# Patient Record
Sex: Female | Born: 1995 | Hispanic: Yes | Marital: Single | Smoking: Never smoker
Health system: Southern US, Community
[De-identification: ages and names within clinical notes are randomized; demographics above are authoritative.]

## PROBLEM LIST (undated history)

## (undated) DIAGNOSIS — K8050 Calculus of bile duct without cholangitis or cholecystitis without obstruction: Secondary | ICD-10-CM

## (undated) DIAGNOSIS — E282 Polycystic ovarian syndrome: Secondary | ICD-10-CM

## (undated) DIAGNOSIS — G5600 Carpal tunnel syndrome, unspecified upper limb: Secondary | ICD-10-CM

## (undated) HISTORY — PX: WISDOM TOOTH EXTRACTION: SHX21

## (undated) HISTORY — DX: Polycystic ovarian syndrome: E28.2

---
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
## 2017-08-22 ENCOUNTER — Other Ambulatory Visit: Payer: Self-pay

## 2017-08-22 ENCOUNTER — Emergency Department
Admission: EM | Admit: 2017-08-22 | Discharge: 2017-08-22 | Disposition: A | Discharge status: Home / Self Care | Payer: Medicaid Other | Attending: Emergency Medicine | Admitting: Emergency Medicine

## 2017-08-22 ENCOUNTER — Encounter: Payer: Self-pay | Admitting: *Deleted

## 2017-08-22 DIAGNOSIS — K2970 Gastritis, unspecified, without bleeding: Secondary | ICD-10-CM | POA: Diagnosis not present

## 2017-08-22 DIAGNOSIS — R079 Chest pain, unspecified: Secondary | ICD-10-CM

## 2017-08-22 LAB — CBC
HCT: 37.1 % (ref 35.0–47.0)
Hemoglobin: 12.8 g/dL (ref 12.0–16.0)
MCH: 29.8 pg (ref 26.0–34.0)
MCHC: 34.5 g/dL (ref 32.0–36.0)
MCV: 86.4 fL (ref 80.0–100.0)
Platelets: 270 10*3/uL (ref 150–440)
RBC: 4.29 MIL/uL (ref 3.80–5.20)
RDW: 13.9 % (ref 11.5–14.5)
WBC: 8.9 10*3/uL (ref 3.6–11.0)

## 2017-08-22 LAB — BASIC METABOLIC PANEL
Anion gap: 7 (ref 5–15)
BUN: 11 mg/dL (ref 6–20)
CALCIUM: 9.1 mg/dL (ref 8.9–10.3)
CO2: 28 mmol/L (ref 22–32)
CREATININE: 0.59 mg/dL (ref 0.44–1.00)
Chloride: 103 mmol/L (ref 101–111)
GFR calc Af Amer: >60 mL/min (ref >60)
GFR calc non Af Amer: >60 mL/min (ref >60)
Glucose, Bld: 142 mg/dL — ABNORMAL HIGH (ref 65–99)
Potassium: 3.5 mmol/L (ref 3.5–5.1)
Sodium: 138 mmol/L (ref 135–145)

## 2017-08-22 LAB — TROPONIN I

## 2017-08-22 LAB — POCT PREGNANCY, URINE: Preg Test, Ur: NEGATIVE

## 2017-08-22 MED ORDER — FAMOTIDINE 40 MG PO TABS: 40.0000 mg | ORAL_TABLET | Freq: Every evening | ORAL | 1 refills | Status: DC

## 2017-08-22 MED ORDER — SUCRALFATE 1 G PO TABS: 1.0000 g | ORAL_TABLET | Freq: Four times a day (QID) | ORAL | 0 refills | Status: DC

## 2017-08-22 MED ORDER — LIDOCAINE VISCOUS 2 % MT SOLN
15.0000 mL | Freq: Once | OROMUCOSAL | Status: AC
  Administered 2017-08-22: 15 mL via OROMUCOSAL
  Filled 2017-08-22: qty 15

## 2017-08-22 NOTE — ED Provider Notes (Signed)
[facility] Emergency Department Provider Note __________________________________   I have reviewed the triage vital signs and the nursing notes.   HISTORY  Chief Complaint Chest Pain   History limited by: Not Limited   HPI Kim Barrett is a 22 y.o. female who presents to the emergency department today because of chest pain.   LOCATION:central chest DURATION:2 days TIMING: waxing and waning SEVERITY: severe QUALITY: sharp CONTEXT: patient denies doing any unusual activity when the pain started. Has come and gone for the past couple of days. Has had somewhat similar pain in the past but nothing as severe. MODIFYING FACTORS: none ASSOCIATED SYMPTOMS: denies any shortness of breath. No fever.  Per medical record review patient has no significant past medical history.  No past medical history on file.  There are no active problems to display for this patient.     Prior to Admission medications   Not on File    Allergies Robitussin 12 hour cough [dextromethorphan polistirex er]  No family history on file.  Social History Social History   Tobacco Use  . Smoking status: Never Smoker  . Smokeless tobacco: Never Used  Substance Use Topics  . Alcohol use: No    Frequency: Never  . Drug use: No    Review of Systems Constitutional: No fever/chills Eyes: No visual changes. ENT: No sore throat. Cardiovascular: Positive for chest pain. Respiratory: Denies shortness of breath. Gastrointestinal: No abdominal pain.  No nausea, no vomiting.  No diarrhea.   Genitourinary: Negative for dysuria. Musculoskeletal: Negative for back pain. Skin: Negative for rash. Neurological: Negative for headaches, focal weakness or numbness.  ____________________________________________   PHYSICAL EXAM:  VITAL SIGNS: ED Triage Vitals  Enc Vitals Group     BP 08/22/17 1949 (!) 123/54     Pulse Rate 08/22/17 1949 85     Resp 08/22/17 1949 20    Temp 08/22/17 1949 98.7 F (37.1 C)     Temp Source 08/22/17 1949 Oral     SpO2 08/22/17 1949 99 %     Weight 08/22/17 1945 224 lb (101.6 kg)     Height 08/22/17 1945 5' (1.524 m)     Head Circumference --      Peak Flow --      Pain Score 08/22/17 1945 9   Constitutional: Alert and oriented. Well appearing and in no distress. Eyes: Conjunctivae are normal.  ENT   Head: Normocephalic and atraumatic.   Nose: No congestion/rhinnorhea.   Mouth/Throat: Mucous membranes are moist.   Neck: No stridor. Hematological/Lymphatic/Immunilogical: No cervical lymphadenopathy. Cardiovascular: Normal rate, regular rhythm.  No murmurs, rubs, or gallops.  Respiratory: Normal respiratory effort without tachypnea nor retractions. Breath sounds are clear and equal bilaterally. No wheezes/rales/rhonchi. Gastrointestinal: Soft and non tender. No rebound. No guarding.  Genitourinary: Deferred Musculoskeletal: Normal range of motion in all extremities. No lower extremity edema. Neurologic:  Normal speech and language. No gross focal neurologic deficits are appreciated.  Skin:  Skin is warm, dry and intact. No rash noted. Psychiatric: Mood and affect are normal. Speech and behavior are normal. Patient exhibits appropriate insight and judgment.  ____________________________________________    LABS (pertinent positives/negatives)  Upreg negative CBC wnl BMP wnl except glu 142 Trop  <0.03  ____________________________________________   EKG  I, Phineas Semen, attending physician, personally viewed and interpreted this EKG  EKG Time: 1952 Rate: 86 Rhythm: normal sinus rhythm Axis: normal Intervals: qtc 421 QRS: narrow ST changes: no st elevation Impression: normal ekg  ____________________________________________    RADIOLOGY  None   ____________________________________________   PROCEDURES  Procedures  ____________________________________________   INITIAL  IMPRESSION / ASSESSMENT AND PLAN / ED COURSE  Pertinent labs & imaging results that were available during my care of the patient were reviewed by me and considered in my medical decision making (see chart for details).  Patient presents with chest pain. ddx would include cardiac cause, esophagitis, gastritis, costochondritis, pna, pe, ptx amongst other etiologies. Patient not complaining of any shortness of breath, no fever. Given lack of pulmonary complaints held x-ray. She did feel better after lidocaine swallow. Think likely then esophagitis/gastritis. Will prescribe antacid and sucralfate. Discussed findings and thoughts with patient. Discussed plan with patient.   ____________________________________________   FINAL CLINICAL IMPRESSION(S) / ED DIAGNOSES  Final diagnoses:  Chest pain, unspecified type  Gastritis, presence of bleeding unspecified, unspecified chronicity, unspecified gastritis type     Note: This dictation was prepared with Dragon dictation. Any transcriptional errors that result from this process are unintentional     Phineas SemenGoodman, Lucas Winograd, MD 08/22/17 2234

## 2017-08-22 NOTE — ED Triage Notes (Signed)
Pt reports chest pain for 3 days. Pain in center of chest.  No cough or fever. Non radiaiting pain.  No n/v/d.  Pt states she feels sore.  Pt alert.  Speech clear.

## 2017-08-22 NOTE — ED Notes (Signed)
Pt c/o sharp chest pain located in center of chest that radiates into bilat shoulders and abd - pt also c/o nausea - chest pain started Monday night but nausea and abd pain started last week

## 2017-08-22 NOTE — ED Notes (Signed)
Pt states that the medication administered helped with her pain. States pain has decreased. No distress noted. Family at bedside.

## 2017-08-22 NOTE — Discharge Instructions (Signed)
Please seek medical attention for any high fevers, chest pain, shortness of breath, change in behavior, persistent vomiting, bloody stool or any other new or concerning symptoms.  

## 2018-09-18 ENCOUNTER — Ambulatory Visit: Payer: Self-pay | Admitting: Maternal Newborn

## 2020-04-12 ENCOUNTER — Other Ambulatory Visit: Payer: Self-pay

## 2020-04-12 ENCOUNTER — Encounter: Payer: Self-pay | Admitting: Family Medicine

## 2020-04-12 ENCOUNTER — Ambulatory Visit: Payer: Self-pay | Admitting: Family Medicine

## 2020-04-12 DIAGNOSIS — Z113 Encounter for screening for infections with a predominantly sexual mode of transmission: Secondary | ICD-10-CM

## 2020-04-12 DIAGNOSIS — E282 Polycystic ovarian syndrome: Secondary | ICD-10-CM | POA: Insufficient documentation

## 2020-04-12 LAB — WET PREP FOR TRICH, YEAST, CLUE
Trichomonas Exam: NEGATIVE
Yeast Exam: NEGATIVE

## 2020-04-12 NOTE — Progress Notes (Signed)
[facility] Department STI clinic/screening visit  Subjective:  Carolyna Yerian is a 24 y.o. female being seen today for No chief complaint on file.    The patient reports they do have symptoms. Patient reports that they do not desire a pregnancy in the next year. They reported they are not interested in discussing contraception today.   Patient has the following medical conditions:   Patient Active Problem List   Diagnosis Date Noted  . PCOS (polycystic ovarian syndrome) 04/12/2020    HPI  Pt reports she is here for STI screening. Has noticed yellow discharge x a few days. No other symptoms.   See flowsheet for further details and programmatic requirements.    Patient's last menstrual period was 02/22/2020 (approximate). Last sex: May 28 BCM: condom sometimes Desires EC? n/a  Last pap per pt/review of record: at [facility] in 05/2019 Last HIV test per pt/review of record: never  Last tetanus vaccine: 2010, none since per pt   No components found for: HCV  The following portions of the patient's history were reviewed and updated as appropriate: allergies, current medications, past medical history, past social history, past surgical history and problem list.  Objective:  There were no vitals filed for this visit.   Physical Exam Vitals and nursing note reviewed.  Constitutional:      Appearance: Normal appearance.  HENT:     Head: Normocephalic and atraumatic.     Mouth/Throat:     Mouth: Mucous membranes are moist.     Pharynx: Oropharynx is clear. No oropharyngeal exudate or posterior oropharyngeal erythema.  Pulmonary:     Effort: Pulmonary effort is normal.  Abdominal:     General: Abdomen is flat.     Palpations: There is no mass.     Tenderness: There is no abdominal tenderness. There is no rebound.  Genitourinary:    General: Normal vulva.     Exam position: Lithotomy position.     Pubic Area: No rash or pubic lice.      Labia:         Right: No rash or lesion.        Left: No rash or lesion.      Vagina: Vaginal discharge (white, ph<4.5) present. No erythema, bleeding or lesions.     Cervix: No cervical motion tenderness, discharge, friability, lesion or erythema.     Uterus: Normal.      Adnexa: Right adnexa normal and left adnexa normal.     Rectum: Normal.  Lymphadenopathy:     Head:     Right side of head: No preauricular or posterior auricular adenopathy.     Left side of head: No preauricular or posterior auricular adenopathy.     Cervical: No cervical adenopathy.     Upper Body:     Right upper body: No supraclavicular or axillary adenopathy.     Left upper body: No supraclavicular or axillary adenopathy.     Lower Body: No right inguinal adenopathy. No left inguinal adenopathy.  Skin:    General: Skin is warm and dry.     Findings: No rash.  Neurological:     Mental Status: She is alert and oriented to person, place, and time.      Assessment and Plan:  Karenna Romanoff is a 24 y.o. female presenting to the [facility] Department for STI screening   1. Screening examination for venereal disease -Pt with symptoms. Screenings today as below. Treat wet prep per standing  order. -Patient does not meet criteria for HepB, HepC Screening.  -Counseled on warning s/sx and when to seek care. Recommended condom use with all sex and discussed importance of condom use for STI prevention. -She declines BCM discussion today, advised to RTC if ever desired.  - WET PREP FOR TRICH, YEAST, CLUE - Chlamydia/Gonorrhea [facility] - HIV [facility] - Syphilis Serology, [facility]  Tetanus vaccine offered today but pt cannot stay. Advised to RTC when able for this.    Return if symptoms worsen or fail to improve.  No future appointments.  Ann Held, PA-C

## 2020-04-12 NOTE — Progress Notes (Signed)
In for STD screen. Reports symptoms today. Desires Tetanus shot, but left early before administration. Encouraged to make another apt. Sharlyne Pacas, RN

## 2020-05-25 ENCOUNTER — Ambulatory Visit (INDEPENDENT_AMBULATORY_CARE_PROVIDER_SITE_OTHER): Payer: BC Managed Care – PPO | Admitting: Orthopaedic Surgery

## 2020-05-25 ENCOUNTER — Encounter: Payer: Self-pay | Admitting: Orthopaedic Surgery

## 2020-05-25 DIAGNOSIS — G5603 Carpal tunnel syndrome, bilateral upper limbs: Secondary | ICD-10-CM

## 2020-05-25 NOTE — Progress Notes (Signed)
   Office Visit Note   Patient: Kim Barrett           Date of Birth: 05-16-1996           MRN: [id number] Visit Date: 05/25/2020              Requested by: No referring provider defined for this encounter. PCP: Pcp, No   Assessment & Plan: Visit Diagnoses:  1. Bilateral carpal tunnel syndrome     Plan: Impression is mild bilateral carpal tunnel syndrome.  We will refer the patient to Dr. Alvester Morin for nerve conduction study/EMG both upper extremities.  She will follow up with [place] once has been completed.  Follow-Up Instructions: Return for after NCS/EMG.   Orders:  No orders of the defined types were placed in this encounter.  No orders of the defined types were placed in this encounter.     Procedures: No procedures performed   Clinical Data: No additional findings.   Subjective: Chief Complaint  Patient presents with  . Right Hand - Pain  . Left Hand - Pain    HPI patient is a pleasant 24 year old left-hand-dominant female who presents to clinic today with bilateral hand tingling both equally as bad.  She noticed this approximately 1 year ago.  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  She has noticed her symptoms were initially worse at night but since she has been wearing a wrist splint to both sides her symptoms have improved.  She does not take any medication for this.  The tingling that she has is located to both index, long and ring fingers.  No previous nerve conduction study.  Review of Systems as detailed in HPI.  All others reviewed and are negative.   Objective: Vital Signs: There were no vitals taken for this visit.  Physical Exam well-developed well-nourished female no acute distress.  Alert and oriented x3.  Ortho Exam right wrist exam shows negative Phalen, positive Tinel and no thenar atrophy.  Full grip strength.  Left wrist is negative Phalen, negative Tinel and no thenar atrophy.  Full grip strength.  She is neurovascular  intact both sides.  Specialty Comments:  No specialty comments available.  Imaging: No new imaging   PMFS History: Patient Active Problem List   Diagnosis Date Noted  . PCOS (polycystic ovarian syndrome) 04/12/2020   History reviewed. No pertinent past medical history.  History reviewed. No pertinent family history.  History reviewed. No pertinent surgical history. Social History   Occupational History  . Not on file  Tobacco Use  . Smoking status: Never Smoker  . Smokeless tobacco: Never Used  Vaping Use  . Vaping Use: Never used  Substance and Sexual Activity  . Alcohol use: No  . Drug use: No  . Sexual activity: Yes    Partners: Male    Birth control/protection: None

## 2020-05-26 ENCOUNTER — Other Ambulatory Visit: Payer: Self-pay

## 2020-05-26 DIAGNOSIS — G5603 Carpal tunnel syndrome, bilateral upper limbs: Secondary | ICD-10-CM

## 2020-05-27 ENCOUNTER — Telehealth: Payer: Self-pay | Admitting: Orthopaedic Surgery

## 2020-05-27 NOTE — Telephone Encounter (Signed)
10/12 ov note faxed to [facility]/referring office [phone], attn: Herbert Seta

## 2020-06-18 ENCOUNTER — Other Ambulatory Visit: Payer: Self-pay

## 2020-06-18 ENCOUNTER — Ambulatory Visit (INDEPENDENT_AMBULATORY_CARE_PROVIDER_SITE_OTHER): Payer: BC Managed Care – PPO | Admitting: Physical Medicine and Rehabilitation

## 2020-06-18 ENCOUNTER — Encounter: Payer: Self-pay | Admitting: Physical Medicine and Rehabilitation

## 2020-06-18 DIAGNOSIS — R202 Paresthesia of skin: Secondary | ICD-10-CM | POA: Diagnosis not present

## 2020-06-18 NOTE — Progress Notes (Signed)
Kim Barrett - 24 y.o. female MRN [id number]  Date of birth: 11-May-1996  Office Visit Note: Visit Date: 06/18/2020 PCP: Pcp, No Referred by: Tarry Kos, MD  Subjective: Chief Complaint  Patient presents with  . Left Hand - Numbness, Pain, Weakness  . Left Arm - Pain, Numbness, Weakness  . Left Wrist - Weakness, Numbness, Pain   HPI:  Kim Barrett is a 24 y.o. female who comes in today at the request of Dr. Glee Arvin for electrodiagnostic study of the Bilateral upper extremities.  Patient is Left hand dominant. bilateral hand tingling both equally as bad.  She noticed this approximately 1 year ago.  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  She has noticed her symptoms were initially worse at night but since she has been wearing a wrist splint to both sides her symptoms have improved.  She does not take any medication for this.  The tingling that she has is located to both index, long and ring fingers.  No previous nerve conduction study.  Average pain is 6 out of 10.   ROS Otherwise per HPI.  Assessment & Plan: Visit Diagnoses:  1. Paresthesia of skin     Plan: Impression: The above electrodiagnostic study is ABNORMAL and reveals evidence of a mild to moderate Bilateral median nerve entrapment at the wrist (carpal tunnel syndrome) affecting sensory components.   There is no significant electrodiagnostic evidence of any other focal nerve entrapment, brachial plexopathy or cervical radiculopathy.   Recommendations: 1.  Follow-up with referring physician. 2.  Continue current management of symptoms. 3.  Continue use of resting splint at night-time and as needed during the day.  Meds & Orders: No orders of the defined types were placed in this encounter.   Orders Placed This Encounter  Procedures  . NCV with EMG (electromyography)    Follow-up: Return for  Glee Arvin, MD.   Procedures: No procedures performed  EMG & NCV Findings: Evaluation  of the right median motor nerve showed decreased conduction velocity (Elbow-Wrist, 49 m/s).  The left median (across palm) sensory nerve showed prolonged distal peak latency (Wrist, 4.5 ms), reduced amplitude (8.1 V), and prolonged distal peak latency (Palm, 2.2 ms).  The right median (across palm) sensory nerve showed prolonged distal peak latency (Wrist, 5.6 ms) and prolonged distal peak latency (Palm, 2.7 ms).  All remaining nerves (as indicated in the following tables) were within normal limits.  All left vs. right side differences were within normal limits.    All examined muscles (as indicated in the following table) showed no evidence of electrical instability.    Impression: The above electrodiagnostic study is ABNORMAL and reveals evidence of a mild to moderate Bilateral median nerve entrapment at the wrist (carpal tunnel syndrome) affecting sensory components.   There is no significant electrodiagnostic evidence of any other focal nerve entrapment, brachial plexopathy or cervical radiculopathy.   Recommendations: 1.  Follow-up with referring physician. 2.  Continue current management of symptoms. 3.  Continue use of resting splint at night-time and as needed during the day.  ___________________________ Elease Hashimoto Board Certified, American Board of Physical Medicine and Rehabilitation    Nerve Conduction Studies Anti Sensory Summary Table   Stim Site NR Peak (ms) Norm Peak (ms) P-T Amp (V) Norm P-T Amp Site1 Site2 Delta-P (ms) Dist (cm) Vel (m/s) Norm Vel (m/s)  Left Median Acr Palm Anti Sensory (2nd Digit)  30.3C  Wrist    *  4.5 <3.6 *8.1 >10 Wrist Palm 2.3 0.0    Palm    *2.2 <2.0 3.7         Right Median Acr Palm Anti Sensory (2nd Digit)  30.7C  Wrist    *5.6 <3.6 18.6 >10 Wrist Palm 2.9 0.0    Palm    *2.7 <2.0 21.1         Left Radial Anti Sensory (Base 1st Digit)  31.4C  Wrist    2.0 <3.1 25.3  Wrist Base 1st Digit 2.0 0.0    Left Ulnar Anti Sensory (5th  Digit)  31C  Wrist    2.9 <3.7 25.7 >15.0 Wrist 5th Digit 2.9 14.0 48 >38   Motor Summary Table   Stim Site NR Onset (ms) Norm Onset (ms) O-P Amp (mV) Norm O-P Amp Site1 Site2 Delta-0 (ms) Dist (cm) Vel (m/s) Norm Vel (m/s)  Left Median Motor (Abd Poll Brev)  31.7C  Wrist    4.1 <4.2 5.3 >5 Elbow Wrist 3.9 19.5 50 >50  Elbow    8.0  1.4         Right Median Motor (Abd Poll Brev)  30.7C  Wrist    4.1 <4.2 8.6 >5 Elbow Wrist 3.5 17.0 *49 >50  Elbow    7.6  7.4         Left Ulnar Motor (Abd Dig Min)  31.8C  Wrist    2.6 <4.2 9.6 >3 B Elbow Wrist 2.6 18.5 71 >53  B Elbow    5.2  9.8  A Elbow B Elbow 1.3 10.0 77 >53  A Elbow    6.5  9.7          EMG   Side Muscle Nerve Root Ins Act Fibs Psw Amp Dur Poly Recrt Int Dennie Bible Comment  Left Abd Poll Brev Median C8-T1 Nml Nml Nml Nml Nml 0 Nml Nml   Left 1stDorInt Ulnar C8-T1 Nml Nml Nml Nml Nml 0 Nml Nml   Left PronatorTeres Median C6-7 Nml Nml Nml Nml Nml 0 Nml Nml     Nerve Conduction Studies Anti Sensory Left/Right Comparison   Stim Site L Lat (ms) R Lat (ms) L-R Lat (ms) L Amp (V) R Amp (V) L-R Amp (%) Site1 Site2 L Vel (m/s) R Vel (m/s) L-R Vel (m/s)  Median Acr Palm Anti Sensory (2nd Digit)  30.3C  Wrist *4.5 *5.6 1.1 *8.1 18.6 56.5 Wrist Palm     Palm *2.2 *2.7 0.5 3.7 21.1 82.5       Radial Anti Sensory (Base 1st Digit)  31.4C  Wrist 2.0   25.3   Wrist Base 1st Digit     Ulnar Anti Sensory (5th Digit)  31C  Wrist 2.9   25.7   Wrist 5th Digit 48     Motor Left/Right Comparison   Stim Site L Lat (ms) R Lat (ms) L-R Lat (ms) L Amp (mV) R Amp (mV) L-R Amp (%) Site1 Site2 L Vel (m/s) R Vel (m/s) L-R Vel (m/s)  Median Motor (Abd Poll Brev)  31.7C  Wrist 4.1 4.1 0.0 5.3 8.6 38.4 Elbow Wrist 50 *49 1  Elbow 8.0 7.6 0.4 1.4 7.4 81.1       Ulnar Motor (Abd Dig Min)  31.8C  Wrist 2.6   9.6   B Elbow Wrist 71    B Elbow 5.2   9.8   A Elbow B Elbow 77    A Elbow 6.5   9.7  Waveforms:                  Clinical History: No specialty comments available.     Objective:  VS:  HT:    WT:   BMI:     BP:   HR: bpm  TEMP: ( )  RESP:  Physical Exam Musculoskeletal:        General: No swelling, tenderness or deformity.     Comments: Inspection reveals no atrophy of the bilateral APB or FDI or hand intrinsics. There is no swelling, color changes, allodynia or dystrophic changes. There is 5 out of 5 strength in the bilateral wrist extension, finger abduction and long finger flexion. There is intact sensation to light touch in all dermatomal and peripheral nerve distributions.  There is a negative Hoffmann's test bilaterally.  Skin:    General: Skin is warm and dry.     Findings: No erythema or rash.  Neurological:     General: No focal deficit present.     Mental Status: She is alert and oriented to person, place, and time.     Motor: No weakness or abnormal muscle tone.     Coordination: Coordination normal.  Psychiatric:        Mood and Affect: Mood normal.        Behavior: Behavior normal.      Imaging: No results found.

## 2020-06-18 NOTE — Progress Notes (Signed)
Pt state left wrist , hand and arm is where the pain is. Pt state while she sleep and driving she feels pain in her hand. Pt state of her hands falling a sleep while holding her phone and texting. Pt state she is left handed.  Numeric Pain Rating Scale and Functional Assessment Average Pain 6   In the last MONTH (on 0-10 scale) has pain interfered with the following?  1. General activity like being  able to carry out your everyday physical activities such as walking, climbing stairs, carrying groceries, or moving a chair?  Rating(10)

## 2020-06-21 NOTE — Procedures (Signed)
EMG & NCV Findings: Evaluation of the right median motor nerve showed decreased conduction velocity (Elbow-Wrist, 49 m/s).  The left median (across palm) sensory nerve showed prolonged distal peak latency (Wrist, 4.5 ms), reduced amplitude (8.1 V), and prolonged distal peak latency (Palm, 2.2 ms).  The right median (across palm) sensory nerve showed prolonged distal peak latency (Wrist, 5.6 ms) and prolonged distal peak latency (Palm, 2.7 ms).  All remaining nerves (as indicated in the following tables) were within normal limits.  All left vs. right side differences were within normal limits.    All examined muscles (as indicated in the following table) showed no evidence of electrical instability.    Impression: The above electrodiagnostic study is ABNORMAL and reveals evidence of a mild to moderate Bilateral median nerve entrapment at the wrist (carpal tunnel syndrome) affecting sensory components.   There is no significant electrodiagnostic evidence of any other focal nerve entrapment, brachial plexopathy or cervical radiculopathy.   Recommendations: 1.  Follow-up with referring physician. 2.  Continue current management of symptoms. 3.  Continue use of resting splint at night-time and as needed during the day.  ___________________________ Elease Hashimoto Board Certified, American Board of Physical Medicine and Rehabilitation    Nerve Conduction Studies Anti Sensory Summary Table   Stim Site NR Peak (ms) Norm Peak (ms) P-T Amp (V) Norm P-T Amp Site1 Site2 Delta-P (ms) Dist (cm) Vel (m/s) Norm Vel (m/s)  Left Median Acr Palm Anti Sensory (2nd Digit)  30.3C  Wrist    *4.5 <3.6 *8.1 >10 Wrist Palm 2.3 0.0    Palm    *2.2 <2.0 3.7         Right Median Acr Palm Anti Sensory (2nd Digit)  30.7C  Wrist    *5.6 <3.6 18.6 >10 Wrist Palm 2.9 0.0    Palm    *2.7 <2.0 21.1         Left Radial Anti Sensory (Base 1st Digit)  31.4C  Wrist    2.0 <3.1 25.3  Wrist Base 1st Digit 2.0 0.0      Left Ulnar Anti Sensory (5th Digit)  31C  Wrist    2.9 <3.7 25.7 >15.0 Wrist 5th Digit 2.9 14.0 48 >38   Motor Summary Table   Stim Site NR Onset (ms) Norm Onset (ms) O-P Amp (mV) Norm O-P Amp Site1 Site2 Delta-0 (ms) Dist (cm) Vel (m/s) Norm Vel (m/s)  Left Median Motor (Abd Poll Brev)  31.7C  Wrist    4.1 <4.2 5.3 >5 Elbow Wrist 3.9 19.5 50 >50  Elbow    8.0  1.4         Right Median Motor (Abd Poll Brev)  30.7C  Wrist    4.1 <4.2 8.6 >5 Elbow Wrist 3.5 17.0 *49 >50  Elbow    7.6  7.4         Left Ulnar Motor (Abd Dig Min)  31.8C  Wrist    2.6 <4.2 9.6 >3 B Elbow Wrist 2.6 18.5 71 >53  B Elbow    5.2  9.8  A Elbow B Elbow 1.3 10.0 77 >53  A Elbow    6.5  9.7          EMG   Side Muscle Nerve Root Ins Act Fibs Psw Amp Dur Poly Recrt Int Dennie Bible Comment  Left Abd Poll Brev Median C8-T1 Nml Nml Nml Nml Nml 0 Nml Nml   Left 1stDorInt Ulnar C8-T1 Nml Nml Nml Nml Nml 0 Nml Nml  Left PronatorTeres Median C6-7 Nml Nml Nml Nml Nml 0 Nml Nml     Nerve Conduction Studies Anti Sensory Left/Right Comparison   Stim Site L Lat (ms) R Lat (ms) L-R Lat (ms) L Amp (V) R Amp (V) L-R Amp (%) Site1 Site2 L Vel (m/s) R Vel (m/s) L-R Vel (m/s)  Median Acr Palm Anti Sensory (2nd Digit)  30.3C  Wrist *4.5 *5.6 1.1 *8.1 18.6 56.5 Wrist Palm     Palm *2.2 *2.7 0.5 3.7 21.1 82.5       Radial Anti Sensory (Base 1st Digit)  31.4C  Wrist 2.0   25.3   Wrist Base 1st Digit     Ulnar Anti Sensory (5th Digit)  31C  Wrist 2.9   25.7   Wrist 5th Digit 48     Motor Left/Right Comparison   Stim Site L Lat (ms) R Lat (ms) L-R Lat (ms) L Amp (mV) R Amp (mV) L-R Amp (%) Site1 Site2 L Vel (m/s) R Vel (m/s) L-R Vel (m/s)  Median Motor (Abd Poll Brev)  31.7C  Wrist 4.1 4.1 0.0 5.3 8.6 38.4 Elbow Wrist 50 *49 1  Elbow 8.0 7.6 0.4 1.4 7.4 81.1       Ulnar Motor (Abd Dig Min)  31.8C  Wrist 2.6   9.6   B Elbow Wrist 71    B Elbow 5.2   9.8   A Elbow B Elbow 77    A Elbow 6.5   9.7            Waveforms:

## 2020-06-25 ENCOUNTER — Other Ambulatory Visit: Payer: Self-pay

## 2020-06-25 ENCOUNTER — Ambulatory Visit (INDEPENDENT_AMBULATORY_CARE_PROVIDER_SITE_OTHER): Payer: BC Managed Care – PPO | Admitting: Orthopaedic Surgery

## 2020-06-25 DIAGNOSIS — G5603 Carpal tunnel syndrome, bilateral upper limbs: Secondary | ICD-10-CM | POA: Diagnosis not present

## 2020-06-25 MED ORDER — BUPIVACAINE HCL 0.5 % IJ SOLN
1.0000 mL | INTRAMUSCULAR | Status: AC | PRN
  Administered 2020-06-25: 1 mL

## 2020-06-25 MED ORDER — METHYLPREDNISOLONE ACETATE 40 MG/ML IJ SUSP
40.0000 mg | INTRAMUSCULAR | Status: AC | PRN
  Administered 2020-06-25: 40 mg

## 2020-06-25 MED ORDER — LIDOCAINE HCL 1 % IJ SOLN
1.0000 mL | INTRAMUSCULAR | Status: AC | PRN
  Administered 2020-06-25: 1 mL

## 2020-06-25 NOTE — Progress Notes (Signed)
   Office Visit Note   Patient: Kim Barrett           Date of Birth: Aug 15, 1995           MRN: [id number] Visit Date: 06/25/2020              Requested by: No referring provider defined for this encounter. PCP: Pcp, No   Assessment & Plan: Visit Diagnoses:  1. Bilateral carpal tunnel syndrome     Plan: Impression is mild to moderate bilateral carpal tunnel syndrome.  Based on discussion of treatment options patient elected to have bilateral carpal tunnel injections.  She will continue to wear braces at night.  Follow-up as needed.  Follow-Up Instructions: Return if symptoms worsen or fail to improve.   Orders:  Orders Placed This Encounter  Procedures  . Hand/UE Inj   No orders of the defined types were placed in this encounter.     Procedures: Hand/UE Inj: bilateral carpal tunnel for carpal tunnel syndrome on 06/25/2020 9:30 AM Indications: pain Details: 25 G needle Medications (Right): 1 mL lidocaine 1 %; 1 mL bupivacaine 0.5 %; 40 mg methylPREDNISolone acetate 40 MG/ML Medications (Left): 1 mL lidocaine 1 %; 1 mL bupivacaine 0.5 %; 40 mg methylPREDNISolone acetate 40 MG/ML Outcome: tolerated well, no immediate complications Patient was prepped and draped in the usual sterile fashion.       Clinical Data: No additional findings.   Subjective: Chief Complaint  Patient presents with  . Right Hand - Follow-up  . Left Hand - Follow-up    Patient returns today following nerve conduction studies.  Results were mild to moderate bilateral carpal tunnel syndrome.   Review of Systems   Objective: Vital Signs: There were no vitals taken for this visit.  Physical Exam  Ortho Exam Exams are unchanged. Specialty Comments:  No specialty comments available.  Imaging: No results found.   PMFS History: Patient Active Problem List   Diagnosis Date Noted  . PCOS (polycystic ovarian syndrome) 04/12/2020   No past medical history on file.  No family  history on file.  No past surgical history on file. Social History   Occupational History  . Not on file  Tobacco Use  . Smoking status: Never Smoker  . Smokeless tobacco: Never Used  Vaping Use  . Vaping Use: Never used  Substance and Sexual Activity  . Alcohol use: No  . Drug use: No  . Sexual activity: Yes    Partners: Male    Birth control/protection: None

## 2021-10-14 ENCOUNTER — Ambulatory Visit: Payer: Self-pay | Admitting: Family Medicine

## 2021-10-14 ENCOUNTER — Encounter: Payer: Self-pay | Admitting: Family Medicine

## 2021-10-14 ENCOUNTER — Other Ambulatory Visit: Payer: Self-pay

## 2021-10-14 DIAGNOSIS — Z113 Encounter for screening for infections with a predominantly sexual mode of transmission: Secondary | ICD-10-CM

## 2021-10-14 DIAGNOSIS — B3731 Acute candidiasis of vulva and vagina: Secondary | ICD-10-CM

## 2021-10-14 LAB — WET PREP FOR TRICH, YEAST, CLUE: Trichomonas Exam: NEGATIVE

## 2021-10-14 LAB — HM HIV SCREENING LAB: HM HIV Screening: NEGATIVE

## 2021-10-14 MED ORDER — CLOTRIMAZOLE 1 % VA CREA: 1.0000 | TOPICAL_CREAM | Freq: Every day | VAGINAL | 0 refills | Status: AC

## 2021-10-14 NOTE — Progress Notes (Signed)
[facility] Department ? ?STI clinic/screening visit ?[address] ?[place] [address] ?[phone] ? ?Subjective:  ?Kim Barrett is a 26 y.o. female being seen today for an STI screening visit. The patient reports they do not have symptoms.  Patient reports that they do not desire a pregnancy in the next year.   They reported they are not interested in discussing contraception today.   ? ?No LMP recorded. (Menstrual status: Irregular Periods). ? ? ?Patient has the following medical conditions:   ?Patient Active Problem List  ? Diagnosis Date Noted  ? PCOS (polycystic ovarian syndrome) 04/12/2020  ? ? ?Chief Complaint  ?Patient presents with  ? SEXUALLY TRANSMITTED DISEASE  ?  screening  ? ? ?HPI ? ?Patient reports here for screening, denies s/sx  ? ?Last HIV test per patient/review of record was 03/2020 ?Patient reports last pap was 2019 @ [facility].  ? ?Screening for MPX risk: ?Does the patient have an unexplained rash? No ?Is the patient MSM? No ?Does the patient endorse multiple sex partners or anonymous sex partners? No ?Did the patient have close or sexual contact with a person diagnosed with MPX? No ?Has the patient traveled outside the [place] where MPX is endemic? No ?Is there a high clinical suspicion for MPX-- evidenced by one of the following No ? -Unlikely to be chickenpox ? -Lymphadenopathy ? -Rash that present in same phase of evolution on any given body part ?See flowsheet for further details and programmatic requirements.  ? ? ?The following portions of the patient's history were reviewed and updated as appropriate: allergies, current medications, past medical history, past social history, past surgical history and problem list. ? ?Objective:  ?There were no vitals filed for this visit. ? ?Physical Exam ?Vitals and nursing note reviewed.  ?Constitutional:   ?   Appearance: Normal appearance.  ?HENT:  ?   Head: Normocephalic and atraumatic.  ?   Mouth/Throat:  ?   Mouth:  Mucous membranes are moist.  ?   Pharynx: Oropharynx is clear. No oropharyngeal exudate or posterior oropharyngeal erythema.  ?Pulmonary:  ?   Effort: Pulmonary effort is normal.  ?Abdominal:  ?   General: Abdomen is flat.  ?   Palpations: There is no mass.  ?   Tenderness: There is no abdominal tenderness. There is no rebound.  ?Genitourinary: ?   Exam position: Lithotomy position.  ?   Pubic Area: No rash or pubic lice.   ?   Labia:     ?   Right: No rash or lesion.     ?   Left: No rash or lesion.   ?   Vagina: No erythema, bleeding or lesions.  ?   Cervix: No cervical motion tenderness, discharge, friability, lesion or erythema.  ?   Uterus: Normal.   ?   Adnexa: Right adnexa normal and left adnexa normal.  ?   Comments: Pt self collected  ?Lymphadenopathy:  ?   Head:  ?   Right side of head: No preauricular or posterior auricular adenopathy.  ?   Left side of head: No preauricular or posterior auricular adenopathy.  ?   Cervical: No cervical adenopathy.  ?   Upper Body:  ?   Right upper body: No supraclavicular or axillary adenopathy.  ?   Left upper body: No supraclavicular or axillary adenopathy.  ?   Lower Body: No right inguinal adenopathy. No left inguinal adenopathy.  ?Skin: ?   General: Skin is warm and  dry.  ?   Findings: No rash.  ?Neurological:  ?   Mental Status: She is alert and oriented to person, place, and time.  ? ? ? ?Assessment and Plan:  ?Kim Barrett is a 26 y.o. female presenting to the [facility] Department for STI screening ? ?1. Screening examination for venereal disease ?Patient accepted all screenings including wet prep oral, vaginal CT/GC and bloodwork for HIV/RPR.  ?Patient meets criteria for HepB screening? No. Ordered? No -   ?Patient meets criteria for HepC screening? No. Ordered? No does not meet criteria  ? ?Wet prep results  neg  ?Treatment needed  ?Discussed time lin for State Lab results and that patient will be called with positive results and encouraged  patient to call if she had not heard in 2 weeks.  ?Counseled to return or seek care for continued or worsening symptoms ?Recommended condom use with all sex ? ?Patient is currently using  NO BCM   to prevent pregnancy.   ?- HIV [place] ?- Syphilis Serology, [place] [facility] ?- WET PREP FOR TRICH, YEAST, CLUE ?- Chlamydia/Gonorrhea [facility] ? ? ? ? ?Return for as needed. ? ?No future appointments. ? ?Wendi Snipes, FNP ?

## 2021-10-14 NOTE — Progress Notes (Signed)
Pt here for STD screening.  Wet mount results reviewed and medication dispensed per Provider orders.  Pt declined condoms. Martika Egler M Esli Clements, RN  

## 2022-02-23 ENCOUNTER — Other Ambulatory Visit: Payer: Self-pay | Admitting: Adult Health

## 2022-02-23 DIAGNOSIS — N946 Dysmenorrhea, unspecified: Secondary | ICD-10-CM

## 2022-02-28 ENCOUNTER — Ambulatory Visit
Admission: RE | Admit: 2022-02-28 | Discharge: 2022-02-28 | Disposition: A | Discharge status: Home / Self Care | Payer: BC Managed Care – PPO | Source: Ambulatory Visit | Attending: Adult Health | Admitting: Adult Health

## 2022-02-28 DIAGNOSIS — N946 Dysmenorrhea, unspecified: Secondary | ICD-10-CM

## 2022-03-28 ENCOUNTER — Ambulatory Visit: Payer: Self-pay | Admitting: Nurse Practitioner

## 2022-03-28 DIAGNOSIS — Z113 Encounter for screening for infections with a predominantly sexual mode of transmission: Secondary | ICD-10-CM

## 2022-03-28 LAB — HM HIV SCREENING LAB: HM HIV Screening: NEGATIVE

## 2022-03-28 LAB — WET PREP FOR TRICH, YEAST, CLUE
Trichomonas Exam: NEGATIVE
Yeast Exam: NEGATIVE

## 2022-03-28 NOTE — Progress Notes (Signed)
[facility] Department  STI clinic/screening visit [address] [place] [address] [phone]  Subjective:  Kim Barrett is a 26 y.o. female being seen today for an STI screening visit. The patient reports they do not have symptoms.  Patient reports that they do not desire a pregnancy in the next year.   They reported they are not interested in discussing contraception today.    No LMP recorded (lmp unknown). (Menstrual status: Irregular Periods).   Patient has the following medical conditions:   Patient Active Problem List   Diagnosis Date Noted   PCOS (polycystic ovarian syndrome) 04/12/2020    Chief Complaint  Patient presents with   SEXUALLY TRANSMITTED DISEASE    Screening-no symptoms    HPI  Patient reports to clinic today for STD screening.  Patient reports she is asymptomatic.    Last HIV test per patient/review of record was 03/2020. Patient reports last pap was 2020.   Screening for MPX risk: Does the patient have an unexplained rash? No Is the patient MSM? No Does the patient endorse multiple sex partners or anonymous sex partners? No Did the patient have close or sexual contact with a person diagnosed with MPX? No Has the patient traveled outside the [place] where MPX is endemic? No Is there a high clinical suspicion for MPX-- evidenced by one of the following No  -Unlikely to be chickenpox  -Lymphadenopathy  -Rash that present in same phase of evolution on any given body part See flowsheet for further details and programmatic requirements.   Immunization history:  Immunization History  Administered Date(s) Administered   Hepatitis A 10/27/2009, 01/24/2012   Hepatitis B 04/26/1998, 08/16/1998, 10/14/1998   Hpv-Unspecified 01/24/2012, 07/24/2012, 06/09/2014   Tdap 03/18/2009     The following portions of the patient's history were reviewed and updated as appropriate: allergies, current medications, past medical history, past  social history, past surgical history and problem list.  Objective:  There were no vitals filed for this visit.  Physical Exam Constitutional:      Appearance: Normal appearance.  HENT:     Head: Normocephalic. No abrasion, masses or laceration. Hair is normal.     Right Ear: External ear normal.     Left Ear: External ear normal.     Nose: Nose normal.     Mouth/Throat:     Mouth: No oral lesions.     Dentition: No dental caries.     Pharynx: No oropharyngeal exudate or posterior oropharyngeal erythema.     Tonsils: No tonsillar exudate or tonsillar abscesses.  Eyes:     General: Lids are normal.        Right eye: No discharge.        Left eye: No discharge.     Conjunctiva/sclera: Conjunctivae normal.     Right eye: No exudate.    Left eye: No exudate. Abdominal:     General: Abdomen is flat.     Palpations: Abdomen is soft.     Tenderness: There is no abdominal tenderness. There is no rebound.  Genitourinary:    Comments: Deferred, patient desires to self collect  Musculoskeletal:     Cervical back: Full passive range of motion without pain, normal range of motion and neck supple.  Lymphadenopathy:     Cervical: No cervical adenopathy.     Right cervical: No superficial, deep or posterior cervical adenopathy.    Left cervical: No superficial, deep or posterior cervical adenopathy.     Upper  Body:     Right upper body: No supraclavicular, axillary or epitrochlear adenopathy.     Left upper body: No supraclavicular, axillary or epitrochlear adenopathy.  Skin:    General: Skin is warm and dry.     Findings: No lesion or rash.  Neurological:     Mental Status: She is alert and oriented to person, place, and time.  Psychiatric:        Attention and Perception: Attention normal.        Mood and Affect: Mood normal.        Speech: Speech normal.        Behavior: Behavior normal. Behavior is cooperative.      Assessment and Plan:  Kim Barrett is a 26 y.o.  female presenting to the [facility] Department for STI screening  1. Screening examination for venereal disease -26 year old female in clinic today for STD screening.  -Patient accepted all screenings including oral GC, vaginal CT/GC , wet prep and bloodwork for HIV/RPR.  Patient meets criteria for HepB screening? No. Ordered? No - low risk Patient meets criteria for HepC screening? No. Ordered? No - low risk  Treat wet prep per standing order Discussed time line for State Lab results and that patient will be called with positive results and encouraged patient to call if she had not heard in 2 weeks.  Counseled to return or seek care for continued or worsening symptoms Recommended condom use with all sex  Patient is currently not using  contraception   to prevent pregnancy.    - HIV [facility] - Syphilis Serology, [place] [facility] - Chlamydia/Gonorrhea [facility] - WET PREP FOR TRICH, YEAST, CLUE - Gonococcus culture  Total time spent: 30 minutes    Return if symptoms worsen or fail to improve.   Glenna Fellows, FNP

## 2022-03-28 NOTE — Progress Notes (Signed)
Pt here for STI screening.  Wet mount reviewed.  No treatment needed at this time.  Condoms declined.-Collins Scotland, RN

## 2022-04-02 LAB — GONOCOCCUS CULTURE

## 2023-01-03 ENCOUNTER — Ambulatory Visit (INDEPENDENT_AMBULATORY_CARE_PROVIDER_SITE_OTHER): Payer: BC Managed Care – PPO | Admitting: Obstetrics and Gynecology

## 2023-01-03 ENCOUNTER — Encounter: Payer: Self-pay | Admitting: Obstetrics and Gynecology

## 2023-01-03 VITALS — BP 122/77 | HR 105 | Ht 60.0 in | Wt 282.5 lb

## 2023-01-03 DIAGNOSIS — Z7689 Persons encountering health services in other specified circumstances: Secondary | ICD-10-CM

## 2023-01-03 DIAGNOSIS — E282 Polycystic ovarian syndrome: Secondary | ICD-10-CM

## 2023-01-03 DIAGNOSIS — N939 Abnormal uterine and vaginal bleeding, unspecified: Secondary | ICD-10-CM

## 2023-01-03 DIAGNOSIS — N926 Irregular menstruation, unspecified: Secondary | ICD-10-CM | POA: Diagnosis not present

## 2023-01-03 DIAGNOSIS — Z3041 Encounter for surveillance of contraceptive pills: Secondary | ICD-10-CM

## 2023-01-03 MED ORDER — LEVONORGEST-ETH ESTRAD 91-DAY 0.15-0.03 &0.01 MG PO TABS: 1.0000 | ORAL_TABLET | Freq: Every day | ORAL | 3 refills | Status: DC

## 2023-01-03 NOTE — Progress Notes (Signed)
Patient presents today to discuss irregular menstrual cycle. She states skipping her cycle for months and then bleeding/spotting for prolonged periods of time. She states a history of being diagnosed with PCO, currently uses nothing for birth control.

## 2023-01-03 NOTE — Progress Notes (Signed)
HPI:      [name] is a 27 y.o. G0P0000 who LMP was No LMP recorded. (Menstrual status: Irregular Periods).  Subjective:   She presents today stating that she has very irregular cycles and sometimes no cycles at all.  This pattern is not new to her.  She was previously on Seasonique birth control and she had periods every 3 months.  She reports a previous diagnosis of PCO.  She was tried on metformin without success but OCPs have worked for her in the past.  She reports that she was also told she had insulin resistance based on acanthosis lesions.  She stopped birth control pills several months ago but had irregular bleeding so she restarted them for a while and they kept her from bleeding.  However when she stopped pills again she bled.  She is not interested in pregnancy at this time. Patient complains of a new's onset of clear vaginal discharge which she is sure is not urine.  She states this has been present for more than 6 months.  She has previously had BV and reports it does not like that.  She also states that she has been tested for vaginal STDs since this discharge has started and it has proven negative.    Hx: The following portions of the patient's history were reviewed and updated as appropriate:             She  has a past medical history of PCOS (polycystic ovarian syndrome). She does not have any pertinent problems on file. She  has a past surgical history that includes Wisdom tooth extraction. Her family history is not on file. She  reports that she has never smoked. She has never used smokeless tobacco. She reports current alcohol use. She reports that she does not use drugs. She has a current medication list which includes the following prescription(s): levonorgestrel-ethinyl estradiol. She is allergic to guaifenesin, bee venom, and robitussin 12 hour cough [dextromethorphan polistirex er].       Review of Systems:  Review of Systems  Constitutional: Denied  constitutional symptoms, night sweats, recent illness, fatigue, fever, insomnia and weight loss.  Eyes: Denied eye symptoms, eye pain, photophobia, vision change and visual disturbance.  Ears/Nose/Throat/Neck: Denied ear, nose, throat or neck symptoms, hearing loss, nasal discharge, sinus congestion and sore throat.  Cardiovascular: Denied cardiovascular symptoms, arrhythmia, chest pain/pressure, edema, exercise intolerance, orthopnea and palpitations.  Respiratory: Denied pulmonary symptoms, asthma, pleuritic pain, productive sputum, cough, dyspnea and wheezing.  Gastrointestinal: Denied, gastro-esophageal reflux, melena, nausea and vomiting.  Genitourinary: See HPI for additional information.  Musculoskeletal: Denied musculoskeletal symptoms, stiffness, swelling, muscle weakness and myalgia.  Dermatologic: Denied dermatology symptoms, rash and scar.  Neurologic: Denied neurology symptoms, dizziness, headache, neck pain and syncope.  Psychiatric: Denied psychiatric symptoms, anxiety and depression.  Endocrine: See HPI for additional information.   Meds:   No current outpatient medications on file prior to visit.   No current facility-administered medications on file prior to visit.      Objective:     Vitals:   01/03/23 1501  BP: 122/77  Pulse: (!) 105   Filed Weights   01/03/23 1501  Weight: 282 lb 8 oz (128.1 kg)                        Assessment:    G0P0000 Patient Active Problem List   Diagnosis Date Noted   PCOS (polycystic ovarian syndrome) 04/12/2020  1. Establishing care with new doctor, encounter for   2. Abnormal uterine bleeding   3. Irregular menstruation   4. PCO (polycystic ovaries)   5. Surveillance for birth control, oral contraceptives     Patient with PCO and likely insulin resistance.  Possibly not previously formally tested.  Cycles previously controlled on OCPs.   Plan:            1.  Will restart Seasonique as risk of blood clot  outweighed by signs and symptoms as well as sequela of PCO.  We have discussed this in some detail.  As long as patient does well on OCPs and is not considering pregnancy no official testing is necessary.  Possibly check for insulin resistance at her annual exam with any additional blood work necessary.  Will also plan her next Pap at her annual exam visit.  2.  Annual exam visit planned for 6 months as well as follow-up of OCPs.  3.  Will plan follow-up of vaginal discharge if still present at annual exam.  If patient would like to be seen prior to that for further workup she has been asked to make an appointment. Orders No orders of the defined types were placed in this encounter.    Meds ordered this encounter  Medications   Levonorgestrel-Ethinyl Estradiol (AMETHIA) 0.15-0.03 &0.01 MG tablet    Sig: Take 1 tablet by mouth at bedtime.    Dispense:  84 tablet    Refill:  3      F/U  Return in about 6 months (around 07/06/2023) for Annual Physical. I spent 32 minutes involved in the care of this patient preparing to see the patient by obtaining and reviewing her medical history (including labs, imaging tests and prior procedures), documenting clinical information in the electronic health record (EHR), counseling and coordinating care plans, writing and sending prescriptions, ordering tests or procedures and in direct communicating with the patient and medical staff discussing pertinent items from her history and physical exam.  [name], M.D. 01/03/2023 3:43 PM

## 2023-01-23 ENCOUNTER — Other Ambulatory Visit: Payer: Self-pay

## 2023-01-23 ENCOUNTER — Telehealth: Payer: Self-pay

## 2023-01-23 DIAGNOSIS — Z3041 Encounter for surveillance of contraceptive pills: Secondary | ICD-10-CM

## 2023-01-23 DIAGNOSIS — E282 Polycystic ovarian syndrome: Secondary | ICD-10-CM

## 2023-01-23 MED ORDER — DESOGESTREL-ETHINYL ESTRADIOL 0.15-0.02/0.01 MG (21/5) PO TABS: 1.0000 | ORAL_TABLET | Freq: Every day | ORAL | 2 refills | Status: DC

## 2023-01-23 NOTE — Telephone Encounter (Signed)
Per Dr. Logan Bores, patient is able to try Mircette. 3 month supply sent to pharmacy. Unable to leave voicemail, sent mychart message.

## 2023-01-23 NOTE — Telephone Encounter (Signed)
Pt calling; saw DJE  who rx'd pills for her PCOS; they are making her nausea, cramping, and makes her stomach hurt.  Is there a diff pill she can take to help with the PCOS?  [phone]

## 2023-01-26 ENCOUNTER — Other Ambulatory Visit (HOSPITAL_COMMUNITY): Payer: Self-pay | Admitting: Surgery

## 2023-02-21 ENCOUNTER — Ambulatory Visit (HOSPITAL_COMMUNITY)
Admission: RE | Admit: 2023-02-21 | Discharge: 2023-02-21 | Disposition: A | Discharge status: Home / Self Care | Payer: BC Managed Care – PPO | Source: Ambulatory Visit | Attending: Surgery | Admitting: Surgery

## 2023-02-21 DIAGNOSIS — Z01818 Encounter for other preprocedural examination: Secondary | ICD-10-CM | POA: Diagnosis present

## 2023-02-22 ENCOUNTER — Encounter: Payer: Self-pay | Admitting: Advanced Practice Midwife

## 2023-02-22 ENCOUNTER — Ambulatory Visit: Payer: BC Managed Care – PPO | Admitting: Advanced Practice Midwife

## 2023-02-22 DIAGNOSIS — F325 Major depressive disorder, single episode, in full remission: Secondary | ICD-10-CM

## 2023-02-22 DIAGNOSIS — Z113 Encounter for screening for infections with a predominantly sexual mode of transmission: Secondary | ICD-10-CM

## 2023-02-22 LAB — WET PREP FOR TRICH, YEAST, CLUE
Trichomonas Exam: NEGATIVE
Yeast Exam: NEGATIVE

## 2023-02-22 LAB — PREGNANCY, URINE: Preg Test, Ur: NEGATIVE

## 2023-02-22 LAB — HM HIV SCREENING LAB: HM HIV Screening: NEGATIVE

## 2023-02-22 NOTE — Progress Notes (Signed)
Pt is here for STD screening.  Wet mount result reviewed, no treatment required per SO.  Condoms declined.  Berdie Ogren, RN

## 2023-02-22 NOTE — Progress Notes (Signed)
[facility]  STI clinic/screening visit [address] [place] [address] [phone]  Subjective:  Kim Barrett is a 27 y.o.SHF nonsmoker nullip female being seen today for an STI screening visit. The patient reports they do not have symptoms.  Patient reports that they do not desire a pregnancy in the next year.   They reported they are not interested in discussing contraception today.    No LMP recorded. (Menstrual status: Irregular Periods).  Patient has the following medical conditions:   Patient Active Problem List   Diagnosis Date Noted   Morbid obesity (HCC) 282 lbs 02/22/2023   PCOS (polycystic ovarian syndrome) 04/12/2020    Chief Complaint  Patient presents with   SEXUALLY TRANSMITTED DISEASE    HPI  Patient reports asymptomatic. Began ocp's in June for PCOS. LMP not sure maybe May? Last sex 02/15/23 without condom; with current partner x 3 wks; 3 sex partners in last 3 mo. Last ETOH 02/15/23 (1 wine cooler) 1-2x/mo. Last pap age 39 wnl.  Does the patient using douching products? No  Last HIV test per patient/review of record was  Lab Results  Component Value Date   HMHIVSCREEN Negative - Validated 03/28/2022   No results found for: "HIV" Patient reports last pap was No results found for: "DIAGPAP" No results found for: "SPECADGYN"  Screening for MPX risk: Does the patient have an unexplained rash? No Is the patient MSM? No Does the patient endorse multiple sex partners or anonymous sex partners? Yes Did the patient have close or sexual contact with a person diagnosed with MPX? No Has the patient traveled outside the [place] where MPX is endemic? No Is there a high clinical suspicion for MPX-- evidenced by one of the following No  -Unlikely to be chickenpox  -Lymphadenopathy  -Rash that present in same phase of evolution on any given body part See flowsheet for further details and programmatic requirements.   Immunization  history:  Immunization History  Administered Date(s) Administered   Hepatitis A 10/27/2009, 01/24/2012   Hepatitis B 04/26/1998, 08/16/1998, 10/14/1998   Hpv-Unspecified 01/24/2012, 07/24/2012, 06/09/2014   Tdap 03/18/2009     The following portions of the patient's history were reviewed and updated as appropriate: allergies, current medications, past medical history, past social history, past surgical history and problem list.  Objective:  There were no vitals filed for this visit.  Physical Exam Vitals and nursing note reviewed.  Constitutional:      Appearance: Normal appearance. She is obese.  HENT:     Head: Normocephalic and atraumatic.     Mouth/Throat:     Mouth: Mucous membranes are moist.     Pharynx: Oropharynx is clear. No oropharyngeal exudate or posterior oropharyngeal erythema.  Eyes:     Conjunctiva/sclera: Conjunctivae normal.  Pulmonary:     Effort: Pulmonary effort is normal.  Abdominal:     Palpations: Abdomen is soft. There is no mass.     Tenderness: There is no abdominal tenderness. There is no rebound.     Comments: Soft without masses or tenderness  Genitourinary:    General: Normal vulva.     Exam position: Lithotomy position.     Pubic Area: No rash or pubic lice.      Labia:        Right: No rash or lesion.        Left: No rash or lesion.      Vagina: Vaginal discharge (white creamy leukorrhea, ph<4.5) present. No erythema,  bleeding or lesions.     Cervix: Normal. No cervical motion tenderness, discharge, friability, lesion or erythema.     Uterus: Normal.      Rectum: Normal.     Comments: pH = <4.5 Lymphadenopathy:     Head:     Right side of head: No preauricular or posterior auricular adenopathy.     Left side of head: No preauricular or posterior auricular adenopathy.     Cervical: No cervical adenopathy.     Right cervical: No superficial, deep or posterior cervical adenopathy.    Left cervical: No superficial, deep or posterior  cervical adenopathy.     Upper Body:     Right upper body: No supraclavicular, axillary or epitrochlear adenopathy.     Left upper body: No supraclavicular, axillary or epitrochlear adenopathy.     Lower Body: No right inguinal adenopathy. No left inguinal adenopathy.  Skin:    General: Skin is warm and dry.     Findings: No rash.  Neurological:     Mental Status: She is alert and oriented to person, place, and time.      Assessment and Plan:  Kim Barrett is a 27 y.o. female presenting to the [facility] Department for STI screening  1. Screening examination for venereal disease Treat wet mount per standing orders Immunization nurse consult  - WET PREP FOR TRICH, YEAST, CLUE - Pregnancy, urine - Syphilis Serology, [facility] - HIV [facility] - Chlamydia/Gonorrhea [facility] - Gonococcus culture  2. Morbid obesity (HCC) 282 lbs    Patient accepted all screenings including oral, vaginal CT/GC and bloodwork for HIV/RPR, and wet prep. Patient meets criteria for HepB screening? No. Ordered? no Patient meets criteria for HepC screening? No. Ordered? no  Treat wet prep per standing order Discussed time line for [facility] results and that patient will be called with positive results and encouraged patient to call if she had not heard in 2 weeks.  Counseled to return or seek care for continued or worsening symptoms Recommended repeat testing in 3 months with positive results. Recommended condom use with all sex  Patient is currently using  ocp's  to prevent pregnancy.    No follow-ups on file.  Future Appointments  Date Time Provider Department Center  03/09/2023 10:55 AM Linzie Collin, MD [facility] None  03/15/2023  2:00 PM Beulah Gandy, RD [facility]    Alberteen Spindle, CNM

## 2023-02-23 ENCOUNTER — Ambulatory Visit: Payer: BC Managed Care – PPO | Admitting: Dietician

## 2023-02-27 LAB — GONOCOCCUS CULTURE

## 2023-03-09 ENCOUNTER — Encounter: Payer: Self-pay | Admitting: Obstetrics and Gynecology

## 2023-03-09 ENCOUNTER — Ambulatory Visit (INDEPENDENT_AMBULATORY_CARE_PROVIDER_SITE_OTHER): Payer: BC Managed Care – PPO | Admitting: Obstetrics and Gynecology

## 2023-03-09 VITALS — BP 114/78 | HR 84 | Ht 60.0 in | Wt 282.2 lb

## 2023-03-09 DIAGNOSIS — N941 Unspecified dyspareunia: Secondary | ICD-10-CM | POA: Diagnosis not present

## 2023-03-09 NOTE — Progress Notes (Signed)
Patient presents today to discuss pain with intercourse. She states always experiencing pain and pressure but believed it would go away with time. She states she has tried lubricants with no relief. Denies postcoital bleeding. No additional concerns.

## 2023-03-09 NOTE — Progress Notes (Signed)
HPI:      [name] is a 27 y.o. G0P0000 who LMP was No LMP recorded (lmp unknown). (Menstrual status: Oral contraceptives).  Subjective:   She presents today because she has pain with sex.  She reports that she has had pain with sex her entire life.  There is never been a time without pain during intercourse.  She states that it is mainly a problem of the deep insertion.  She has no pain on the outside.  She reports that she has been tested recently for STDs, has tried lubrication both without success.  She says that sometimes when she has the pain he can withdrawal and then reinserted and the pain resolves.  She does not feel as if the patient is position dependent. She is currently taking OCPs for birth control.    Hx: The following portions of the patient's history were reviewed and updated as appropriate:             She  has a past medical history of PCOS (polycystic ovarian syndrome). She does not have any pertinent problems on file. She  has a past surgical history that includes Wisdom tooth extraction. Her family history is not on file. She  reports that she has never smoked. She has never used smokeless tobacco. She reports current alcohol use of about 1.0 standard drink of alcohol per week. She reports that she does not use drugs. She has a current medication list which includes the following prescription(s): desogestrel-ethinyl estradiol. She is allergic to guaifenesin, bee venom, and robitussin 12 hour cough [dextromethorphan polistirex er].       Review of Systems:  Review of Systems  Constitutional: Denied constitutional symptoms, night sweats, recent illness, fatigue, fever, insomnia and weight loss.  Eyes: Denied eye symptoms, eye pain, photophobia, vision change and visual disturbance.  Ears/Nose/Throat/Neck: Denied ear, nose, throat or neck symptoms, hearing loss, nasal discharge, sinus congestion and sore throat.  Cardiovascular: Denied cardiovascular  symptoms, arrhythmia, chest pain/pressure, edema, exercise intolerance, orthopnea and palpitations.  Respiratory: Denied pulmonary symptoms, asthma, pleuritic pain, productive sputum, cough, dyspnea and wheezing.  Gastrointestinal: Denied, gastro-esophageal reflux, melena, nausea and vomiting.  Genitourinary: See HPI for additional information.  Musculoskeletal: Denied musculoskeletal symptoms, stiffness, swelling, muscle weakness and myalgia.  Dermatologic: Denied dermatology symptoms, rash and scar.  Neurologic: Denied neurology symptoms, dizziness, headache, neck pain and syncope.  Psychiatric: Denied psychiatric symptoms, anxiety and depression.  Endocrine: Denied endocrine symptoms including hot flashes and night sweats.   Meds:   Current Outpatient Medications on File Prior to Visit  Medication Sig Dispense Refill   desogestrel-ethinyl estradiol (MIRCETTE) 0.15-0.02/0.01 MG (21/5) tablet Take 1 tablet by mouth at bedtime. 28 tablet 2   No current facility-administered medications on file prior to visit.      Objective:     Vitals:   03/09/23 1050  BP: 114/78  Pulse: 84   Filed Weights   03/09/23 1050  Weight: 282 lb 3.2 oz (128 kg)              Physical examination   Pelvic:   Vulva: Normal appearance.  No lesions.  Vagina: No lesions or abnormalities noted.  Support: Normal pelvic support.  Urethra No masses tenderness or scarring.  Meatus Normal size without lesions or prolapse.  Cervix: Normal appearance.  No lesions.  Anus: Normal exam.  No lesions.  Perineum: Normal exam.  No lesions.        Bimanual   Uterus: Normal size.  Non-tender.  Mobile.  AV.  Adnexae: No masses.  Non-tender to palpation.  Cul-de-sac: Negative for abnormality.   Her pain is localized to the upper posterior vagina.  Past the bulbocavernosus and perineal body and before the cervix.  There is absolutely no cervical motion tenderness.  This is somewhat localized to the posterior vagina  as described.  There is does not seem to be stool in the rectum at this time.  She states that this pain is the pain that she is referring to but reports that it is much worse with intercourse than it is today with my exam.          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 Dyspareunia in female     Posterior vaginal pain.  I find no pelvic abnormalities.  A previous ultrasound finds an anteverted uterus and no pelvic abnormalities.     Plan:            1.  Discussed the exact location of the pain.  Reassured her seems to be nothing physically wrong.  Strategies of positioning during intercourse discussed.  Emptying the rectum prior to intercourse discussed.  Possible referral to specialist and dyspareunia at patient desire. Orders No orders of the defined types were placed in this encounter.   No orders of the defined types were placed in this encounter.     F/U  Return for Pt to contact us if symptoms worsen. I spent 35 minutes involved in the care of this patient preparing to see the patient by obtaining and reviewing her medical history (including labs, imaging tests and prior procedures), documenting clinical information in the electronic health record (EHR), counseling and coordinating care plans, writing and sending prescriptions, ordering tests or procedures and in direct communicating with the patient and medical staff discussing pertinent items from her history and physical exam.  [name], M.D. 03/09/2023 1:06 PM

## 2023-03-15 ENCOUNTER — Encounter: Payer: BC Managed Care – PPO | Attending: Surgery | Admitting: Dietician

## 2023-03-15 ENCOUNTER — Encounter: Payer: Self-pay | Admitting: Dietician

## 2023-03-15 VITALS — Ht 60.0 in | Wt 281.0 lb

## 2023-03-15 DIAGNOSIS — E669 Obesity, unspecified: Secondary | ICD-10-CM | POA: Diagnosis not present

## 2023-03-15 NOTE — Progress Notes (Signed)
Nutrition Assessment for Bariatric Surgery: Pre-Surgery Behavioral and Nutrition Intervention Program   Medical Nutrition Therapy  Appt Start Time: 1:55    End Time: 2:57  Patient was seen on 03/15/2023 for Pre-Operative Nutrition Assessment. Purpose of todays visit  enhance perioperative outcomes along with a healthy weight maintenance   Referral stated Supervised Weight Loss (SWL) visits needed: 0  Pt completed visits.   Pt has cleared nutrition requirements.   Planned surgery: RYGB Pt expectation of surgery: to lose about 100 lbs, stating, mainly to feel better.   NUTRITION ASSESSMENT   Anthropometrics  Start weight at [facility]: 281.0 lbs (date: 03/15/2023)  Height: 60 in BMI: 54.88 kg/m2     Clinical   Pharmacotherapy: History of weight loss medication used: wegovy, Mounjaro; also stated she was prescribed something many years ago, but states she doesn't remember what it was. Also, OTC wt loss meds.  Medical hx: obesity Medications: birth control  Labs: LDL 106; Vit D 24.2; glucose 114; iron saturation 12; A1c 5.8 Notable signs/symptoms: none noted Any previous deficiencies? 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  Current Physical Activity Recommendations state 150 minutes per week of moderate to vigorous movement including Cardio and 1-2 days of resistance activities as well as flexibility/balance activities:  Pts current physical activity: walking 2-3 days a week for 30 minutes, with 50% recommendation reached   Sleep Hygiene: duration and quality: okay, stating she has been waking in the night, but will go back to sleep sometimes. Sleeping late in the summer.  Current Patient  Perceived Stress Level as stated by pt on a scale of 1-10:  2       Stress Management Techniques: deep breath; therapy  According to the Dietary Guidelines for Americans Recommendation: equivalent 1.5-2 cups fruits per day, equivalent 2-3 cups vegetables per day and at least half all grains whole  Fruit servings per day (on average): 0-1, meeting 0% recommendation  Non-starchy vegetable servings per day (on average): 0, meeting 0% recommendation  Whole Grains per day (on average): 0  Number of meals missed/skipped per week out of 21: 7-8  24-Hr Dietary Recall First Meal: sandwich with orange juice Snack:  Second Meal: skip Snack: yogurt Third Meal: rice, beans, chicken or pork chop, potatoes, meat, tacos Snack: ice cream or cake or cookies Beverages: water, juice, ice coffee (almond milk, vanilla syrup, heavy cream)  Alcoholic beverages per week: 0-1 or 2   Estimated Energy Needs Calories: 1500  NUTRITION DIAGNOSIS  Overweight/obesity (Casar-3.3) related to past poor dietary habits and physical inactivity as evidenced by patient w/ planned RYGB surgery following dietary guidelines for continued weight loss.  NUTRITION INTERVENTION  Nutrition counseling (C-1) and education (E-2) to facilitate bariatric surgery goals.  Educated pt on micronutrient deficiencies post-surgery and behavioral/dietary strategies to start in order to mitigate that risk   Behavioral and Dietary Interventions Pre-Op Goals Reviewed with the Patient Nutrition: Healthy Eating Behaviors Switch to non-caloric, non-carbonated and non-caffeinated beverages such as  water, unsweetened tea, Crystal Light and zero calorie beverages (aim for 64 oz. per day) Cut out grazing between meals or at night  Find a protein shake you like Eat every 3-5 hours        Eliminate distractions  while eating (TV, computer, reading, driving, texting) Take 45-40 minutes to eat a meal  Decrease high sugar foods/decrease high fat/fried  foods Eliminate alcoholic beverages Increase protein intake (eggs, fish, chicken, yogurt) before surgery; track protein, aiming for 60 grams per day. Eat non starchy vegetables 2 times a day 7 days a week Eat complex carbohydrates such as whole grains and fruits   Behavioral Modification: Physical Activity Increase my usual daily activity (use stairs, park farther, etc.) Engage in _______________________  activity  _______ minutes ______ times per week  Other:    _________________________________________________________________     Problem Solving I will think about my usual eating patterns and how to tweak them How can my friends and family support me Barriers to starting my changes Learn and understand appetite verses hunger   Healthy Coping Allow for ___________ activities per week to help me manage stress Reframe negative thoughts I will keep a picture of someone or something that is my inspiration & look at it daily   Monitoring  Weigh myself once a week  Measure my progress by monitoring how my clothes fit Keep a food record of what I eat and drink for the next ________ (time period) Take pictures of what I eat and drink for the next ________ (time period) Use an app to count steps/day for the next_______ (time period) Measure my progress such as increased energy and more restful sleep Monitor your acid reflux and bowel habits, are they getting better?   *Goals that are bolded indicate the pt would like to start working towards these  Handouts Provided Include  Bariatric Surgery handouts (Nutrition Visits, Pre Surgery Behavioral Change Goals, Protein Shakes Brands to Choose From, Vitamins & Mineral Supplementation)  Learning Style & Readiness for Change Teaching method utilized: Visual, Auditory, and hands on  Demonstrated degree of understanding via: Teach Back  Readiness Level: preparation Barriers to learning/adherence to lifestyle change: nothing identified  RD's  Notes for Next Visit     MONITORING & EVALUATION Dietary intake, weekly physical activity, body weight, and preoperative behavioral change goals   Next Steps  Pt has completed visits. No further supervised visits required/recommended. Patient is to follow up at [facility] for pre-op class >2 weeks prior to scheduled surgery.

## 2023-04-18 ENCOUNTER — Other Ambulatory Visit: Payer: Self-pay | Admitting: Obstetrics and Gynecology

## 2023-04-18 DIAGNOSIS — Z3041 Encounter for surveillance of contraceptive pills: Secondary | ICD-10-CM

## 2023-04-18 DIAGNOSIS — E282 Polycystic ovarian syndrome: Secondary | ICD-10-CM

## 2023-04-25 ENCOUNTER — Other Ambulatory Visit: Payer: Self-pay

## 2023-04-25 DIAGNOSIS — Z3041 Encounter for surveillance of contraceptive pills: Secondary | ICD-10-CM

## 2023-04-25 DIAGNOSIS — N921 Excessive and frequent menstruation with irregular cycle: Secondary | ICD-10-CM

## 2023-04-25 MED ORDER — NORETHIN ACE-ETH ESTRAD-FE 1.5-30 MG-MCG PO TABS: 1.0000 | ORAL_TABLET | Freq: Every day | ORAL | 3 refills | Status: DC

## 2023-04-25 MED ORDER — MEDROXYPROGESTERONE ACETATE 5 MG PO TABS: 5.0000 mg | ORAL_TABLET | Freq: Every day | ORAL | 0 refills | Status: DC

## 2023-04-25 NOTE — Telephone Encounter (Signed)
Spoke with Dr. Logan Bores. New plan is to try LoEstin 1.5/30 along with a 20 day trial of Provera until she reaches her placebo pills. Patient is aware of change and understands.

## 2023-05-03 ENCOUNTER — Ambulatory Visit: Payer: Self-pay | Admitting: Surgery

## 2023-05-03 NOTE — H&P (View-Only) (Signed)
Shea Mcbeth [id number]    Referring Provider:  Self     Subjective    Chief Complaint: No chief complaint on file.       History of Present Illness:    Patient has completed the bariatric pathway with no barriers identified.  Her labs did show a low vitamin D and she was instructed to start an over-the-counter supplement; she has been cleared by the dietitian.  Chest x-ray/upper GI negative; no hiatal hernia.  She has questions to address today.  She is appropriately anxious about upcoming surgery.  She has her preop diet plan to start next week.  01/26/23: 27 year old woman with history of PCOS and morbid obesity presents for consultation regarding surgical treatment of the latter.  She has troubled with obesity her entire life, beginning in elementary school.  Reports a strong family history of this on the maternal side but is not aware of any on the paternal side.  Also reports family history of diabetes and at one point she was prediabetic but this has resolved.  Previous weight loss attempts have included keto diet, fasting, Herbalife, exercise programs, Slim fast, diet pills.  She does not feel that any of these things have had any kind of success.  She feels that she continues to gain weight each year and describes pain in various parts of her body including her feet, her back, shortness of breath with minimal activity.  She hopes that bariatric surgery will help her lose weight and afford her more energy, better management of her PCOS.  She is interested in gastric bypass.  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  She feels that she has a fairly well-balanced eating pattern, but does have a proclivity towards sweets and states that this is actually a daily habit, does endorse sweetened beverages particularly iced coffee.   No previous abdominal surgeries.     Review of Systems: A complete review of systems  was obtained from the patient.  I have reviewed this information and discussed as appropriate with the patient.  See HPI as well for other ROS.      Denies medical problems other than PCOS. No prior surgery. Fam hx as above.   Allergies      Allergies  Allergen Reactions   Robitussin [Guaifenesin] Swelling        Medications Ordered Prior to Encounter        Current Outpatient Medications on File Prior to Visit  Medication Sig Dispense Refill   LO LOESTRIN FE 1 mg-10 mcg (24)/10 mcg (2) TK 1 T PO QD        No current facility-administered medications on file prior to visit.        Family History       Family History  Problem Relation Age of Onset   Hyperlipidemia (Elevated cholesterol) Mother     Obesity Mother     Diabetes Maternal Grandmother     Breast cancer Maternal Aunt     Diabetes Maternal Uncle          Tobacco Use History  Social History       Tobacco Use  Smoking Status Never  Smokeless Tobacco Never        Social History  Social History         Socioeconomic History   Marital status: Single  Tobacco Use   Smoking status: Never   Smokeless tobacco: Never  Vaping Use   Vaping status: Never Used  Substance and Sexual Activity   Alcohol use: Never   Drug use: Never   Sexual activity: Not Currently      Partners: Male      Birth control/protection: None        Objective:      There were no vitals filed for this visit.  Ht:  Wt:  [id number]:[id number] is no height or weight on file to calculate BSA. There is no height or weight on file to calculate BMI.     Gen: A&Ox3, no distress  Unlabored respirations Psych: appropriate mood and affect, normal insight/judgment intact         Assessment and Plan:  Morbid obesity (CMS/HHS-HCC)  (primary encounter diagnosis)  PCOS (polycystic ovarian syndrome)       She remains a candidate for bariatric surgery and is interested in Roux-en-Y gastric bypass.  We have previously discussed the surgery  including relevant anatomy, technical aspects and risks including bleeding, infection, pain, scarring, injury to intra-abdominal structures, staple line/anastomotic leak or abscess, chronic abdominal pain or nausea, marginal ulcer, internal hernia, dumping syndrome, DVT/PE, pneumonia, heart attack, stroke, death, failure to reach weight loss goals and weight regain, hernia.  Reviewed the typical peri-, and postoperative course.  Discussed and emphasized the importance/necessity of lifelong behavioral/ lifestyle changes to combat the CHRONIC and relapsing disease which is obesity.        Hunter Bachar Carlye Grippe, MD

## 2023-05-03 NOTE — H&P (Signed)
Shea Mcbeth [id number]    Referring Provider:  Self     Subjective    Chief Complaint: No chief complaint on file.       History of Present Illness:    Patient has completed the bariatric pathway with no barriers identified.  Her labs did show a low vitamin D and she was instructed to start an over-the-counter supplement; she has been cleared by the dietitian.  Chest x-ray/upper GI negative; no hiatal hernia.  She has questions to address today.  She is appropriately anxious about upcoming surgery.  She has her preop diet plan to start next week.  01/26/23: 27 year old woman with history of PCOS and morbid obesity presents for consultation regarding surgical treatment of the latter.  She has troubled with obesity her entire life, beginning in elementary school.  Reports a strong family history of this on the maternal side but is not aware of any on the paternal side.  Also reports family history of diabetes and at one point she was prediabetic but this has resolved.  Previous weight loss attempts have included keto diet, fasting, Herbalife, exercise programs, Slim fast, diet pills.  She does not feel that any of these things have had any kind of success.  She feels that she continues to gain weight each year and describes pain in various parts of her body including her feet, her back, shortness of breath with minimal activity.  She hopes that bariatric surgery will help her lose weight and afford her more energy, better management of her PCOS.  She is interested in gastric bypass.  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  She feels that she has a fairly well-balanced eating pattern, but does have a proclivity towards sweets and states that this is actually a daily habit, does endorse sweetened beverages particularly iced coffee.   No previous abdominal surgeries.     Review of Systems: A complete review of systems  was obtained from the patient.  I have reviewed this information and discussed as appropriate with the patient.  See HPI as well for other ROS.      Denies medical problems other than PCOS. No prior surgery. Fam hx as above.   Allergies      Allergies  Allergen Reactions   Robitussin [Guaifenesin] Swelling        Medications Ordered Prior to Encounter        Current Outpatient Medications on File Prior to Visit  Medication Sig Dispense Refill   LO LOESTRIN FE 1 mg-10 mcg (24)/10 mcg (2) TK 1 T PO QD        No current facility-administered medications on file prior to visit.        Family History       Family History  Problem Relation Age of Onset   Hyperlipidemia (Elevated cholesterol) Mother     Obesity Mother     Diabetes Maternal Grandmother     Breast cancer Maternal Aunt     Diabetes Maternal Uncle          Tobacco Use History  Social History       Tobacco Use  Smoking Status Never  Smokeless Tobacco Never        Social History  Social History         Socioeconomic History   Marital status: Single  Tobacco Use   Smoking status: Never   Smokeless tobacco: Never  Vaping Use   Vaping status: Never Used  Substance and Sexual Activity   Alcohol use: Never   Drug use: Never   Sexual activity: Not Currently      Partners: Male      Birth control/protection: None        Objective:      There were no vitals filed for this visit.  Ht:  Wt:  ZOX:WRUEA is no height or weight on file to calculate BSA. There is no height or weight on file to calculate BMI.     Gen: A&Ox3, no distress  Unlabored respirations Psych: appropriate mood and affect, normal insight/judgment intact         Assessment and Plan:  Morbid obesity (CMS/HHS-HCC)  (primary encounter diagnosis)  PCOS (polycystic ovarian syndrome)       She remains a candidate for bariatric surgery and is interested in Roux-en-Y gastric bypass.  We have previously discussed the surgery  including relevant anatomy, technical aspects and risks including bleeding, infection, pain, scarring, injury to intra-abdominal structures, staple line/anastomotic leak or abscess, chronic abdominal pain or nausea, marginal ulcer, internal hernia, dumping syndrome, DVT/PE, pneumonia, heart attack, stroke, death, failure to reach weight loss goals and weight regain, hernia.  Reviewed the typical peri-, and postoperative course.  Discussed and emphasized the importance/necessity of lifelong behavioral/ lifestyle changes to combat the CHRONIC and relapsing disease which is obesity.        Hunter Bachar Carlye Grippe, MD

## 2023-05-07 ENCOUNTER — Encounter: Payer: BC Managed Care – PPO | Attending: Surgery | Admitting: Dietician

## 2023-05-07 VITALS — Ht 60.0 in | Wt 285.6 lb

## 2023-05-07 DIAGNOSIS — Z713 Dietary counseling and surveillance: Secondary | ICD-10-CM | POA: Diagnosis not present

## 2023-05-07 DIAGNOSIS — E669 Obesity, unspecified: Secondary | ICD-10-CM

## 2023-05-07 DIAGNOSIS — Z6841 Body Mass Index (BMI) 40.0 and over, adult: Secondary | ICD-10-CM | POA: Insufficient documentation

## 2023-05-07 NOTE — Progress Notes (Unsigned)
Pre-Operative Nutrition Class:    Patient was seen on 05/07/2023 for Pre-Operative Bariatric Surgery Education at [facility].    Surgery date: 05/22/2023 Surgery type: RYGB  Anthropometrics  Start weight at [facility]: 281.0 lbs (date: 03/15/2023)  Height: 60 in Weight today: 285.6 BMI: 55.78 kg/m2     Clinical   Pharmacotherapy: History of weight loss medication used: wegovy, Mounjaro; also stated she was prescribed something many years ago, but states she doesn't remember what it was. Also, OTC wt loss meds.  Medical hx: obesity Medications: birth control  Labs: LDL 106; Vit D 24.2; glucose 114; iron saturation 12; A1c 5.8 Notable signs/symptoms: none noted Any previous deficiencies? No  Samples given per MNT protocol. Patient educated on appropriate usage: [employer] Multivitamin Lot # [id number] Exp: 02/27   [employer] Calcium  Lot # [id number] Exp: 08/06/2023   [employer] Calcium  Lot # [id number] Exp: 08/25  The following the learning objectives were met by the patient during this course: Identify Pre-Op Dietary Goals and will begin 2 weeks pre-operatively Identify appropriate sources of fluids and proteins  State protein recommendations and appropriate sources pre and post-operatively Identify Post-Operative Dietary Goals and will follow for 2 weeks post-operatively Identify appropriate multivitamin and calcium sources Describe the need for physical activity post-operatively and will follow MD recommendations State when to call healthcare provider regarding medication questions or post-operative complications When having a diagnosis of diabetes understanding hypoglycemia symptoms and the inclusion of 1 complex carbohydrate per meal  Handouts given during class include: Pre-Op Bariatric Surgery Diet Handout Protein Shake Handout Post-Op Bariatric Surgery Nutrition Handout BELT Program Information Flyer Support Group  Information Flyer [facility] Bariatric Supplements Price List  Follow-Up Plan: Patient will follow-up at [facility] 2 weeks post operatively for diet advancement per MD.

## 2023-05-08 ENCOUNTER — Encounter: Payer: Self-pay | Admitting: Dietician

## 2023-05-08 NOTE — Telephone Encounter (Signed)
Pt called for clarification on pre-op (liver shrinking) diet. Pt recalled what she had today and asked if what she was doing was okay. Dietitian agreed and advised. Pt expressed understanding and questions were answered to patient's satisfaction.

## 2023-05-17 ENCOUNTER — Encounter (HOSPITAL_COMMUNITY)
Admission: RE | Admit: 2023-05-17 | Discharge: 2023-05-17 | Disposition: A | Discharge status: Home / Self Care | Payer: BC Managed Care – PPO | Source: Ambulatory Visit | Attending: Surgery | Admitting: Surgery

## 2023-05-17 ENCOUNTER — Encounter (HOSPITAL_COMMUNITY): Payer: Self-pay

## 2023-05-17 ENCOUNTER — Other Ambulatory Visit: Payer: Self-pay

## 2023-05-17 DIAGNOSIS — Z6841 Body Mass Index (BMI) 40.0 and over, adult: Secondary | ICD-10-CM | POA: Insufficient documentation

## 2023-05-17 DIAGNOSIS — Z01818 Encounter for other preprocedural examination: Secondary | ICD-10-CM | POA: Diagnosis present

## 2023-05-17 DIAGNOSIS — Z01812 Encounter for preprocedural laboratory examination: Secondary | ICD-10-CM | POA: Insufficient documentation

## 2023-05-17 HISTORY — DX: Carpal tunnel syndrome, unspecified upper limb: G56.00

## 2023-05-17 LAB — CBC WITH DIFFERENTIAL/PLATELET
Abs Immature Granulocytes: 0.03 10*3/uL (ref 0.00–0.07)
Basophils Absolute: 0 10*3/uL (ref 0.0–0.1)
Basophils Relative: 0 %
Eosinophils Absolute: 0 10*3/uL (ref 0.0–0.5)
Eosinophils Relative: 0 %
HCT: 40.9 % (ref 36.0–46.0)
Hemoglobin: 13 g/dL (ref 12.0–15.0)
Immature Granulocytes: 0 %
Lymphocytes Relative: 27 %
Lymphs Abs: 1.9 10*3/uL (ref 0.7–4.0)
MCH: 27.8 pg (ref 26.0–34.0)
MCHC: 31.8 g/dL (ref 30.0–36.0)
MCV: 87.4 fL (ref 80.0–100.0)
Monocytes Absolute: 0.6 10*3/uL (ref 0.1–1.0)
Monocytes Relative: 8 %
Neutro Abs: 4.5 10*3/uL (ref 1.7–7.7)
Neutrophils Relative %: 65 %
Platelets: 303 10*3/uL (ref 150–400)
RBC: 4.68 MIL/uL (ref 3.87–5.11)
RDW: 14.2 % (ref 11.5–15.5)
WBC: 7 10*3/uL (ref 4.0–10.5)
nRBC: 0 % (ref 0.0–0.2)

## 2023-05-17 LAB — COMPREHENSIVE METABOLIC PANEL
ALT: 43 U/L (ref 0–44)
AST: 23 U/L (ref 15–41)
Albumin: 4 g/dL (ref 3.5–5.0)
Alkaline Phosphatase: 56 U/L (ref 38–126)
Anion gap: 12 (ref 5–15)
BUN: 15 mg/dL (ref 6–20)
CO2: 23 mmol/L (ref 22–32)
Calcium: 9.4 mg/dL (ref 8.9–10.3)
Chloride: 102 mmol/L (ref 98–111)
Creatinine, Ser: 0.61 mg/dL (ref 0.44–1.00)
GFR, Estimated: >60 mL/min (ref >60)
Glucose, Bld: 88 mg/dL (ref 70–99)
Potassium: 3.6 mmol/L (ref 3.5–5.1)
Sodium: 137 mmol/L (ref 135–145)
Total Bilirubin: 0.4 mg/dL (ref 0.3–1.2)
Total Protein: 7.9 g/dL (ref 6.5–8.1)

## 2023-05-17 NOTE — Progress Notes (Addendum)
COVID Vaccine Completed: yes  Date of COVID positive in last 90 days: no  PCP - Franco Nones, FNP Cardiologist - n/a  Chest x-ray - 02/21/23 Epic EKG - 02/21/23 Epic sinus tachy Stress Test - n/a ECHO - n/a Cardiac Cath - n/a Pacemaker/ICD device last checked: n/a Spinal Cord Stimulator: n/a  Bowel Prep - no solids after 6pm night before  Sleep Study - n/a CPAP -   Fasting Blood Sugar - pre at one point, no longer an issues Checks Blood Sugar _____ times a day  Last dose of GLP1 agonist-  N/A GLP1 instructions:  N/A   Last dose of SGLT-2 inhibitors-  N/A SGLT-2 instructions: N/A   Blood Thinner Instructions:  n/a Aspirin Instructions: Last Dose:  Activity level: Can go up a flight of stairs and perform activities of daily living without stopping and without symptoms of chest pain. SOB with activity, not new due to weight    Anesthesia review:   Patient denies shortness of breath, fever, cough and chest pain at PAT appointment  Patient verbalized understanding of instructions that were given to them at the PAT appointment. Patient was also instructed that they will need to review over the PAT instructions again at home before surgery.

## 2023-05-17 NOTE — Patient Instructions (Addendum)
SURGICAL WAITING ROOM VISITATION  Patients having surgery or a procedure may have no more than 2 support people in the waiting area - these visitors may rotate.    Children under the age of 43 must have an adult with them who is not the patient.  Due to an increase in RSV and influenza rates and associated hospitalizations, children ages 74 and under may not visit patients in [facility] hospitals.  If the patient needs to stay at the hospital during part of their recovery, the visitor guidelines for inpatient rooms apply. Pre-op nurse will coordinate an appropriate time for 1 support person to accompany patient in pre-op.  This support person may not rotate.    Please refer to the [facility] website for the visitor guidelines for Inpatients (after your surgery is over and you are in a regular room).    Your procedure is scheduled on: 05/22/23   Report to [facility] Main Entrance    Report to admitting at 11:00 AM   Call this number if you have problems the morning of surgery [phone]   MORNING OF SURGERY DRINK:   DRINK 1 G2 drink BEFORE YOU LEAVE HOME, DRINK ALL OF THE  G2 DRINK AT ONE TIME.   NO SOLID FOOD AFTER 600 PM THE NIGHT BEFORE YOUR SURGERY. YOU MAY DRINK CLEAR FLUIDS. THE G2 DRINK YOU DRINK BEFORE YOU LEAVE HOME WILL BE THE LAST FLUIDS YOU DRINK BEFORE SURGERY.  PAIN IS EXPECTED AFTER SURGERY AND WILL NOT BE COMPLETELY ELIMINATED. AMBULATION AND TYLENOL WILL HELP REDUCE INCISIONAL AND GAS PAIN. MOVEMENT IS KEY!  YOU ARE EXPECTED TO BE OUT OF BED WITHIN 4 HOURS OF ADMISSION TO YOUR PATIENT ROOM.  SITTING IN THE RECLINER THROUGHOUT THE DAY IS IMPORTANT FOR DRINKING FLUIDS AND MOVING GAS THROUGHOUT THE GI TRACT.  COMPRESSION STOCKINGS SHOULD BE WORN [facility] STAY UNLESS YOU ARE WALKING.   INCENTIVE SPIROMETER SHOULD BE USED EVERY HOUR WHILE AWAKE TO DECREASE POST-OPERATIVE COMPLICATIONS SUCH AS PNEUMONIA.  WHEN DISCHARGED HOME, IT IS IMPORTANT TO  CONTINUE TO WALK EVERY HOUR AND USE THE INCENTIVE SPIROMETER EVERY HOUR.    You may have the following liquids until 10:15 AM DAY OF SURGERY  Water Non-Citrus Juices (without pulp, NO RED-Apple, White grape, White cranberry) Black Coffee (NO MILK/CREAM OR CREAMERS, sugar ok)  Clear Tea (NO MILK/CREAM OR CREAMERS, sugar ok) regular and decaf                             Plain Jell-O (NO RED)                                           Fruit ices (not with fruit pulp, NO RED)                                     Popsicles (NO RED)                                                               Sports drinks like Gatorade (NO  RED)                 The day of surgery:  Drink ONE (1) Pre-Surgery G2 at 8:15 AM the morning of surgery. Drink in one sitting. Do not sip.  This drink was given to you during your hospital  pre-op appointment visit. Nothing else to drink after completing the  Pre-Surgery G2.          If you have questions, please contact your surgeon's office.   FOLLOW BOWEL PREP AND ANY ADDITIONAL PRE OP INSTRUCTIONS YOU RECEIVED FROM YOUR SURGEON'S OFFICE!!!     Oral Hygiene is also important to reduce your risk of infection.                                    Remember - BRUSH YOUR TEETH THE MORNING OF SURGERY WITH YOUR REGULAR TOOTHPASTE  DENTURES WILL BE REMOVED PRIOR TO SURGERY PLEASE DO NOT APPLY "Poly grip" OR ADHESIVES!!!   Stop all vitamins and herbal supplements 7 days before surgery.   Take these medicines the morning of surgery with A SIP OF WATER: Tylenol                               You may not have any metal on your body including hair pins, jewelry, and body piercing             Do not wear make-up, lotions, powders, perfumes, or deodorant  Do not wear nail polish including gel and S&S, artificial/acrylic nails, or any other type of covering on natural nails including finger and toenails. If you have artificial nails, gel coating, etc. that needs to be removed by  a nail salon please have this removed prior to surgery or surgery may need to be canceled/ delayed if the surgeon/ anesthesia feels like they are unable to be safely monitored.   Do not shave  48 hours prior to surgery.    Do not bring valuables to the hospital. [facility] IS NOT             RESPONSIBLE   FOR VALUABLES.   Contacts, glasses, dentures or bridgework may not be worn into surgery.   Bring small overnight bag day of surgery.   DO NOT BRING YOUR HOME MEDICATIONS TO THE HOSPITAL. PHARMACY WILL DISPENSE MEDICATIONS LISTED ON YOUR MEDICATION LIST TO YOU DURING YOUR ADMISSION IN THE HOSPITAL!              Please read over the following fact sheets you were given: IF YOU HAVE QUESTIONS ABOUT YOUR PRE-OP INSTRUCTIONS PLEASE CALL [phone] Contras   If you received a COVID test during your pre-op visit  it is requested that you wear a mask when out in public, stay away from anyone that may not be feeling well and notify your surgeon if you develop symptoms. If you test positive for Covid or have been in contact with anyone that has tested positive in the last 10 days please notify you surgeon.    [place] - Preparing for Surgery Before surgery, you can play an important role.  Because skin is not sterile, your skin needs to be as free of germs as possible.  You can reduce the number of germs on your skin by washing with CHG (chlorahexidine gluconate) soap before surgery.  CHG is an antiseptic cleaner  which kills germs and bonds with the skin to continue killing germs even after washing. Please DO NOT use if you have an allergy to CHG or antibacterial soaps.  If your skin becomes reddened/irritated stop using the CHG and inform your nurse when you arrive at Short Stay. Do not shave (including legs and underarms) for at least 48 hours prior to the first CHG shower.  You may shave your face/neck.  Please follow these instructions carefully:  1.  Shower with CHG Soap the night before  surgery and the  morning of surgery.  2.  If you choose to wash your hair, wash your hair first as usual with your normal  shampoo.  3.  After you shampoo, rinse your hair and body thoroughly to remove the shampoo.                             4.  Use CHG as you would any other liquid soap.  You can apply chg directly to the skin and wash.  Gently with a scrungie or clean washcloth.  5.  Apply the CHG Soap to your body ONLY FROM THE NECK DOWN.   Do   not use on face/ open                           Wound or open sores. Avoid contact with eyes, ears mouth and   genitals (private parts).                       Wash face,  Genitals (private parts) with your normal soap.             6.  Wash thoroughly, paying special attention to the area where your    surgery  will be performed.  7.  Thoroughly rinse your body with warm water from the neck down.  8.  DO NOT shower/wash with your normal soap after using and rinsing off the CHG Soap.                9.  Pat yourself dry with a clean towel.            10.  Wear clean pajamas.            11.  Place clean sheets on your bed the night of your first shower and do not  sleep with pets. Day of Surgery : Do not apply any lotions/deodorants the morning of surgery.  Please wear clean clothes to the hospital/surgery center.  FAILURE TO FOLLOW THESE INSTRUCTIONS MAY RESULT IN THE CANCELLATION OF YOUR SURGERY  PATIENT SIGNATURE_________________________________  NURSE SIGNATURE__________________________________  ________________________________________________________________________ WHAT IS A BLOOD TRANSFUSION? Blood Transfusion Information  A transfusion is the replacement of blood or some of its parts. Blood is made up of multiple cells which provide different functions. Red blood cells carry oxygen and are used for blood loss replacement. White blood cells fight against infection. Platelets control bleeding. Plasma helps clot blood. Other blood products  are available for specialized needs, such as hemophilia or other clotting disorders. BEFORE THE TRANSFUSION  Who gives blood for transfusions?  Healthy volunteers who are fully evaluated to make sure their blood is safe. This is blood bank blood. Transfusion therapy is the safest it has ever been in the practice of medicine. Before blood is taken from a donor, a complete history is taken to make  sure that person has no history of diseases nor engages in risky social behavior (examples are intravenous drug use or sexual activity with multiple partners). The donor's travel history is screened to minimize risk of transmitting infections, such as malaria. The donated blood is tested for signs of infectious diseases, such as HIV and hepatitis. The blood is then tested to be sure it is compatible with you in order to minimize the chance of a transfusion reaction. If you or a relative donates blood, this is often done in anticipation of surgery and is not appropriate for emergency situations. It takes many days to process the donated blood. RISKS AND COMPLICATIONS Although transfusion therapy is very safe and saves many lives, the main dangers of transfusion include:  Getting an infectious disease. Developing a transfusion reaction. This is an allergic reaction to something in the blood you were given. Every precaution is taken to prevent this. The decision to have a blood transfusion has been considered carefully by your caregiver before blood is given. Blood is not given unless the benefits outweigh the risks. AFTER THE TRANSFUSION Right after receiving a blood transfusion, you will usually feel much better and more energetic. This is especially true if your red blood cells have gotten low (anemic). The transfusion raises the level of the red blood cells which carry oxygen, and this usually causes an energy increase. The nurse administering the transfusion will monitor you carefully for complications. HOME  CARE INSTRUCTIONS  No special instructions are needed after a transfusion. You may find your energy is better. Speak with your caregiver about any limitations on activity for underlying diseases you may have. SEEK MEDICAL CARE IF:  Your condition is not improving after your transfusion. You develop redness or irritation at the intravenous (IV) site. SEEK IMMEDIATE MEDICAL CARE IF:  Any of the following symptoms occur over the next 12 hours: Shaking chills. You have a temperature by mouth above 102 F (38.9 C), not controlled by medicine. Chest, back, or muscle pain. People around you feel you are not acting correctly or are confused. Shortness of breath or difficulty breathing. Dizziness and fainting. You get a rash or develop hives. You have a decrease in urine output. Your urine turns a dark color or changes to pink, red, or brown. Any of the following symptoms occur over the next 10 days: You have a temperature by mouth above 102 F (38.9 C), not controlled by medicine. Shortness of breath. Weakness after normal activity. The white part of the eye turns yellow (jaundice). You have a decrease in the amount of urine or are urinating less often. Your urine turns a dark color or changes to pink, red, or brown. Document Released: 07/28/2000 Document Revised: 10/23/2011 Document Reviewed: 03/16/2008 [facility] Patient Information 2014 [place], [place].  _______________________________________________________________________

## 2023-05-18 NOTE — Discharge Instructions (Signed)

## 2023-05-21 ENCOUNTER — Telehealth: Payer: Self-pay | Admitting: Dietician

## 2023-05-21 NOTE — Telephone Encounter (Signed)
Pt called stating she has surgery tomorrow and has not had a bowel movement recently. Pt asked if she could take Miralax now. Dietitian advised that she could before 6 pm tonight, and no food after 6 pm tonight. Patient express understanding and questions were answered to their satisfaction.

## 2023-05-22 ENCOUNTER — Encounter (HOSPITAL_COMMUNITY): Payer: Self-pay | Admitting: Surgery

## 2023-05-22 ENCOUNTER — Inpatient Hospital Stay (HOSPITAL_COMMUNITY): Payer: BC Managed Care – PPO | Admitting: Anesthesiology

## 2023-05-22 ENCOUNTER — Inpatient Hospital Stay (HOSPITAL_COMMUNITY)
Admission: RE | Admit: 2023-05-22 | Discharge: 2023-05-23 | DRG: 621 | Disposition: A | Discharge status: Home / Self Care | Payer: BC Managed Care – PPO | Attending: Surgery | Admitting: Surgery

## 2023-05-22 ENCOUNTER — Other Ambulatory Visit: Payer: Self-pay

## 2023-05-22 ENCOUNTER — Encounter (HOSPITAL_COMMUNITY)
Admission: RE | Disposition: A | Discharge status: Home / Self Care | Payer: Self-pay | Source: Home / Self Care | Attending: Surgery

## 2023-05-22 DIAGNOSIS — Z888 Allergy status to other drugs, medicaments and biological substances status: Secondary | ICD-10-CM

## 2023-05-22 DIAGNOSIS — E282 Polycystic ovarian syndrome: Secondary | ICD-10-CM | POA: Diagnosis present

## 2023-05-22 DIAGNOSIS — Z91030 Bee allergy status: Secondary | ICD-10-CM

## 2023-05-22 DIAGNOSIS — Z803 Family history of malignant neoplasm of breast: Secondary | ICD-10-CM | POA: Diagnosis not present

## 2023-05-22 DIAGNOSIS — Z6841 Body Mass Index (BMI) 40.0 and over, adult: Secondary | ICD-10-CM

## 2023-05-22 DIAGNOSIS — E559 Vitamin D deficiency, unspecified: Secondary | ICD-10-CM | POA: Diagnosis present

## 2023-05-22 DIAGNOSIS — Z833 Family history of diabetes mellitus: Secondary | ICD-10-CM

## 2023-05-22 HISTORY — PX: GASTRIC ROUX-EN-Y: SHX5262

## 2023-05-22 LAB — ABO/RH: ABO/RH(D): B POS

## 2023-05-22 LAB — TYPE AND SCREEN
ABO/RH(D): B POS
Antibody Screen: NEGATIVE

## 2023-05-22 LAB — POCT PREGNANCY, URINE: Preg Test, Ur: NEGATIVE

## 2023-05-22 SURGERY — LAPAROSCOPIC ROUX-EN-Y GASTRIC BYPASS WITH UPPER ENDOSCOPY
Anesthesia: General

## 2023-05-22 MED ORDER — ENSURE MAX PROTEIN PO LIQD
2.0000 [oz_av] | ORAL | Status: DC
  Administered 2023-05-23 (×3): 2 [oz_av] via ORAL

## 2023-05-22 MED ORDER — 0.9 % SODIUM CHLORIDE (POUR BTL) OPTIME
TOPICAL | Status: DC | PRN
Start: 2023-05-22 — End: 2023-05-22
  Administered 2023-05-22: 1000 mL

## 2023-05-22 MED ORDER — FENTANYL CITRATE PF 50 MCG/ML IJ SOSY
25.0000 ug | PREFILLED_SYRINGE | INTRAMUSCULAR | Status: DC | PRN
  Administered 2023-05-22: 50 ug via INTRAVENOUS

## 2023-05-22 MED ORDER — GABAPENTIN 100 MG PO CAPS
100.0000 mg | ORAL_CAPSULE | ORAL | Status: AC
  Administered 2023-05-22: 100 mg via ORAL
  Filled 2023-05-22: qty 1

## 2023-05-22 MED ORDER — BUPIVACAINE LIPOSOME 1.3 % IJ SUSP
INTRAMUSCULAR | Status: AC
  Filled 2023-05-22: qty 20

## 2023-05-22 MED ORDER — HYDROMORPHONE HCL 1 MG/ML IJ SOLN
INTRAMUSCULAR | Status: DC | PRN
Start: 2023-05-22 — End: 2023-05-22
  Administered 2023-05-22 (×2): .4 mg via INTRAVENOUS
  Administered 2023-05-22: 1.2 mg via INTRAVENOUS

## 2023-05-22 MED ORDER — ROCURONIUM BROMIDE 100 MG/10ML IV SOLN
INTRAVENOUS | Status: DC | PRN
  Administered 2023-05-22 (×2): 10 mg via INTRAVENOUS
  Administered 2023-05-22: 70 mg via INTRAVENOUS

## 2023-05-22 MED ORDER — HEPARIN SODIUM (PORCINE) 5000 UNIT/ML IJ SOLN
5000.0000 [IU] | INTRAMUSCULAR | Status: AC
  Administered 2023-05-22: 5000 [IU] via SUBCUTANEOUS
  Filled 2023-05-22: qty 1

## 2023-05-22 MED ORDER — CHLORHEXIDINE GLUCONATE 4 % EX SOLN: 60.0000 mL | Freq: Once | CUTANEOUS | Status: DC

## 2023-05-22 MED ORDER — "VISTASEAL 4 ML SINGLE DOSE KIT "
4.0000 mL | PACK | Freq: Once | CUTANEOUS | Status: DC
  Filled 2023-05-22: qty 4

## 2023-05-22 MED ORDER — MIDAZOLAM HCL 2 MG/2ML IJ SOLN
INTRAMUSCULAR | Status: AC
  Filled 2023-05-22: qty 2

## 2023-05-22 MED ORDER — LACTATED RINGERS IR SOLN
Status: DC | PRN
  Administered 2023-05-22: 1000 mL

## 2023-05-22 MED ORDER — HYDROMORPHONE HCL 1 MG/ML IJ SOLN: 0.5000 mg | INTRAMUSCULAR | Status: DC | PRN

## 2023-05-22 MED ORDER — BUPIVACAINE-EPINEPHRINE 0.25% -1:200000 IJ SOLN
INTRAMUSCULAR | Status: DC | PRN
  Administered 2023-05-22: 30 mL

## 2023-05-22 MED ORDER — METHOCARBAMOL 500 MG IVPB - SIMPLE MED: 500.0000 mg | Freq: Four times a day (QID) | INTRAVENOUS | Status: DC | PRN

## 2023-05-22 MED ORDER — BUPIVACAINE LIPOSOME 1.3 % IJ SUSP: 20.0000 mL | Freq: Once | INTRAMUSCULAR | Status: DC

## 2023-05-22 MED ORDER — DEXAMETHASONE SODIUM PHOSPHATE 10 MG/ML IJ SOLN
INTRAMUSCULAR | Status: DC | PRN
Start: 2023-05-22 — End: 2023-05-22
  Administered 2023-05-22: 8 mg via INTRAVENOUS

## 2023-05-22 MED ORDER — METOPROLOL TARTRATE 5 MG/5ML IV SOLN: 5.0000 mg | Freq: Four times a day (QID) | INTRAVENOUS | Status: DC | PRN

## 2023-05-22 MED ORDER — PANTOPRAZOLE SODIUM 40 MG IV SOLR
40.0000 mg | Freq: Every day | INTRAVENOUS | Status: DC
  Administered 2023-05-22: 40 mg via INTRAVENOUS
  Filled 2023-05-22: qty 10

## 2023-05-22 MED ORDER — TRAMADOL HCL 50 MG PO TABS
50.0000 mg | ORAL_TABLET | Freq: Four times a day (QID) | ORAL | Status: DC | PRN
  Administered 2023-05-22: 50 mg via ORAL
  Filled 2023-05-22: qty 1

## 2023-05-22 MED ORDER — GABAPENTIN 100 MG PO CAPS
100.0000 mg | ORAL_CAPSULE | Freq: Two times a day (BID) | ORAL | Status: DC
  Administered 2023-05-22 – 2023-05-23 (×2): 100 mg via ORAL
  Filled 2023-05-22 (×2): qty 1

## 2023-05-22 MED ORDER — STERILE WATER FOR IRRIGATION IR SOLN
Status: DC | PRN
  Administered 2023-05-22: 1000 mL

## 2023-05-22 MED ORDER — FIBRIN SEALANT 2 ML SINGLE DOSE KIT
2.0000 mL | PACK | Freq: Once | CUTANEOUS | Status: DC
  Filled 2023-05-22: qty 2

## 2023-05-22 MED ORDER — ORAL CARE MOUTH RINSE: 15.0000 mL | Freq: Once | OROMUCOSAL | Status: AC

## 2023-05-22 MED ORDER — FENTANYL CITRATE PF 50 MCG/ML IJ SOSY
PREFILLED_SYRINGE | INTRAMUSCULAR | Status: AC
  Filled 2023-05-22: qty 1

## 2023-05-22 MED ORDER — OXYCODONE HCL 5 MG/5ML PO SOLN
5.0000 mg | Freq: Four times a day (QID) | ORAL | Status: DC | PRN
  Administered 2023-05-23 (×2): 5 mg via ORAL
  Filled 2023-05-22 (×2): qty 5

## 2023-05-22 MED ORDER — DOCUSATE SODIUM 100 MG PO CAPS
100.0000 mg | ORAL_CAPSULE | Freq: Two times a day (BID) | ORAL | Status: DC
  Administered 2023-05-22 – 2023-05-23 (×2): 100 mg via ORAL
  Filled 2023-05-22 (×2): qty 1

## 2023-05-22 MED ORDER — FENTANYL CITRATE (PF) 100 MCG/2ML IJ SOLN
INTRAMUSCULAR | Status: AC
  Filled 2023-05-22: qty 2

## 2023-05-22 MED ORDER — ROCURONIUM BROMIDE 10 MG/ML (PF) SYRINGE
PREFILLED_SYRINGE | INTRAVENOUS | Status: AC
  Filled 2023-05-22: qty 10

## 2023-05-22 MED ORDER — MIDAZOLAM HCL 5 MG/5ML IJ SOLN
INTRAMUSCULAR | Status: DC | PRN
  Administered 2023-05-22 (×2): 2 mg via INTRAVENOUS

## 2023-05-22 MED ORDER — ONDANSETRON HCL 4 MG/2ML IJ SOLN: 4.0000 mg | INTRAMUSCULAR | Status: DC | PRN

## 2023-05-22 MED ORDER — CHLORHEXIDINE GLUCONATE 0.12 % MT SOLN
15.0000 mL | Freq: Once | OROMUCOSAL | Status: AC
  Administered 2023-05-22: 15 mL via OROMUCOSAL

## 2023-05-22 MED ORDER — METHOCARBAMOL 500 MG IVPB - SIMPLE MED
INTRAVENOUS | Status: AC
  Administered 2023-05-22: 500 mg via INTRAVENOUS
  Filled 2023-05-22: qty 55

## 2023-05-22 MED ORDER — BUPIVACAINE LIPOSOME 1.3 % IJ SUSP
INTRAMUSCULAR | Status: DC | PRN
  Administered 2023-05-22: 20 mL

## 2023-05-22 MED ORDER — METOCLOPRAMIDE HCL 5 MG/ML IJ SOLN
10.0000 mg | Freq: Four times a day (QID) | INTRAMUSCULAR | Status: DC
  Administered 2023-05-22 – 2023-05-23 (×3): 10 mg via INTRAVENOUS
  Filled 2023-05-22 (×3): qty 2

## 2023-05-22 MED ORDER — SUGAMMADEX SODIUM 200 MG/2ML IV SOLN
INTRAVENOUS | Status: DC | PRN
Start: 2023-05-22 — End: 2023-05-22
  Administered 2023-05-22: 200 mg via INTRAVENOUS

## 2023-05-22 MED ORDER — ESMOLOL HCL 100 MG/10ML IV SOLN
INTRAVENOUS | Status: AC
  Filled 2023-05-22: qty 10

## 2023-05-22 MED ORDER — ACETAMINOPHEN 160 MG/5ML PO SOLN
1000.0000 mg | Freq: Three times a day (TID) | ORAL | Status: DC
  Administered 2023-05-22: 1000 mg via ORAL
  Filled 2023-05-22: qty 40.6

## 2023-05-22 MED ORDER — OXYCODONE HCL 5 MG PO TABS: 5.0000 mg | ORAL_TABLET | Freq: Once | ORAL | Status: DC | PRN

## 2023-05-22 MED ORDER — SCOPOLAMINE 1 MG/3DAYS TD PT72
1.0000 | MEDICATED_PATCH | TRANSDERMAL | Status: DC
  Administered 2023-05-22: 1.5 mg via TRANSDERMAL
  Filled 2023-05-22: qty 1

## 2023-05-22 MED ORDER — PROPOFOL 1000 MG/100ML IV EMUL
INTRAVENOUS | Status: AC
  Filled 2023-05-22: qty 100

## 2023-05-22 MED ORDER — OXYCODONE HCL 5 MG/5ML PO SOLN: 5.0000 mg | Freq: Once | ORAL | Status: DC | PRN

## 2023-05-22 MED ORDER — ESMOLOL HCL 100 MG/10ML IV SOLN
INTRAVENOUS | Status: DC | PRN
Start: 2023-05-22 — End: 2023-05-22
  Administered 2023-05-22: 30 mg via INTRAVENOUS
  Administered 2023-05-22: 20 mg via INTRAVENOUS

## 2023-05-22 MED ORDER — LIDOCAINE HCL (PF) 2 % IJ SOLN
INTRAMUSCULAR | Status: AC
  Filled 2023-05-22: qty 5

## 2023-05-22 MED ORDER — HYDROMORPHONE HCL 2 MG/ML IJ SOLN
INTRAMUSCULAR | Status: AC
  Filled 2023-05-22: qty 1

## 2023-05-22 MED ORDER — HEPARIN SODIUM (PORCINE) 5000 UNIT/ML IJ SOLN
5000.0000 [IU] | Freq: Three times a day (TID) | INTRAMUSCULAR | Status: DC
  Administered 2023-05-23: 5000 [IU] via SUBCUTANEOUS
  Filled 2023-05-22: qty 1

## 2023-05-22 MED ORDER — FENTANYL CITRATE (PF) 100 MCG/2ML IJ SOLN
INTRAMUSCULAR | Status: DC | PRN
  Administered 2023-05-22: 100 ug via INTRAVENOUS
  Administered 2023-05-22: 25 ug via INTRAVENOUS
  Administered 2023-05-22 (×2): 50 ug via INTRAVENOUS
  Administered 2023-05-22: 25 ug via INTRAVENOUS
  Administered 2023-05-22: 50 ug via INTRAVENOUS

## 2023-05-22 MED ORDER — ONDANSETRON HCL 4 MG/2ML IJ SOLN
INTRAMUSCULAR | Status: AC
  Filled 2023-05-22: qty 2

## 2023-05-22 MED ORDER — ONDANSETRON HCL 4 MG/2ML IJ SOLN
INTRAMUSCULAR | Status: DC | PRN
  Administered 2023-05-22: 4 mg via INTRAVENOUS

## 2023-05-22 MED ORDER — APREPITANT 40 MG PO CAPS
40.0000 mg | ORAL_CAPSULE | ORAL | Status: AC
  Administered 2023-05-22: 40 mg via ORAL
  Filled 2023-05-22: qty 1

## 2023-05-22 MED ORDER — DEXAMETHASONE SODIUM PHOSPHATE 10 MG/ML IJ SOLN
INTRAMUSCULAR | Status: AC
  Filled 2023-05-22: qty 1

## 2023-05-22 MED ORDER — METHOCARBAMOL 500 MG PO TABS
ORAL_TABLET | ORAL | Status: AC
  Filled 2023-05-22: qty 1

## 2023-05-22 MED ORDER — SODIUM CHLORIDE 0.9 % IV SOLN
2.0000 g | INTRAVENOUS | Status: AC
  Administered 2023-05-22: 2 g via INTRAVENOUS
  Filled 2023-05-22: qty 2

## 2023-05-22 MED ORDER — DEXMEDETOMIDINE HCL IN NACL 80 MCG/20ML IV SOLN
INTRAVENOUS | Status: DC | PRN
Start: 2023-05-22 — End: 2023-05-22
  Administered 2023-05-22: 4 ug via INTRAVENOUS
  Administered 2023-05-22: 8 ug via INTRAVENOUS
  Administered 2023-05-22: 4 ug via INTRAVENOUS

## 2023-05-22 MED ORDER — SODIUM CHLORIDE 0.9 % IV SOLN: 12.5000 mg | INTRAVENOUS | Status: DC | PRN

## 2023-05-22 MED ORDER — OXYCODONE HCL 5 MG/5ML PO SOLN
ORAL | Status: AC
  Administered 2023-05-22: 5 mg via ORAL
  Filled 2023-05-22: qty 5

## 2023-05-22 MED ORDER — LIDOCAINE HCL (CARDIAC) PF 100 MG/5ML IV SOSY
PREFILLED_SYRINGE | INTRAVENOUS | Status: DC | PRN
  Administered 2023-05-22: 60 mg via INTRAVENOUS

## 2023-05-22 MED ORDER — HYDRALAZINE HCL 20 MG/ML IJ SOLN: 10.0000 mg | INTRAMUSCULAR | Status: DC | PRN

## 2023-05-22 MED ORDER — BUPIVACAINE-EPINEPHRINE 0.25% -1:200000 IJ SOLN
INTRAMUSCULAR | Status: AC
  Filled 2023-05-22: qty 1

## 2023-05-22 MED ORDER — PROPOFOL 10 MG/ML IV BOLUS
INTRAVENOUS | Status: DC | PRN
  Administered 2023-05-22: 200 mg via INTRAVENOUS

## 2023-05-22 MED ORDER — PROPOFOL 10 MG/ML IV BOLUS
INTRAVENOUS | Status: AC
  Filled 2023-05-22: qty 20

## 2023-05-22 MED ORDER — SODIUM CHLORIDE 0.9 % IV SOLN
INTRAVENOUS | Status: DC
  Administered 2023-05-22: 1000 mL via INTRAVENOUS

## 2023-05-22 MED ORDER — ACETAMINOPHEN 500 MG PO TABS
1000.0000 mg | ORAL_TABLET | Freq: Three times a day (TID) | ORAL | Status: DC
  Administered 2023-05-23: 1000 mg via ORAL
  Filled 2023-05-22: qty 2

## 2023-05-22 MED ORDER — SIMETHICONE 80 MG PO CHEW
80.0000 mg | CHEWABLE_TABLET | Freq: Four times a day (QID) | ORAL | Status: DC | PRN
  Administered 2023-05-22: 80 mg via ORAL
  Filled 2023-05-22: qty 1

## 2023-05-22 MED ORDER — DEXMEDETOMIDINE HCL IN NACL 80 MCG/20ML IV SOLN
INTRAVENOUS | Status: AC
  Filled 2023-05-22: qty 20

## 2023-05-22 MED ORDER — ACETAMINOPHEN 500 MG PO TABS
1000.0000 mg | ORAL_TABLET | ORAL | Status: AC
  Administered 2023-05-22: 1000 mg via ORAL
  Filled 2023-05-22: qty 2

## 2023-05-22 MED ORDER — LACTATED RINGERS IV SOLN: INTRAVENOUS | Status: DC

## 2023-05-22 MED ORDER — AMISULPRIDE (ANTIEMETIC) 5 MG/2ML IV SOLN: 10.0000 mg | Freq: Once | INTRAVENOUS | Status: DC | PRN

## 2023-05-22 SURGICAL SUPPLY — 80 items
ANTIFOG SOL W/FOAM PAD STRL (MISCELLANEOUS) ×1
APL LAPSCP 35 DL APL RGD (MISCELLANEOUS) ×1
APL PRP STRL LF DISP 70% ISPRP (MISCELLANEOUS) ×2
APL SKNCLS STERI-STRIP NONHPOA (GAUZE/BANDAGES/DRESSINGS) ×1
APL SWBSTK 6 STRL LF DISP (MISCELLANEOUS)
APPLICATOR COTTON TIP 6 STRL (MISCELLANEOUS) IMPLANT
APPLICATOR COTTON TIP 6IN STRL (MISCELLANEOUS)
APPLICATOR VISTASEAL 35 (MISCELLANEOUS) ×1 IMPLANT
APPLIER CLIP ROT 13.4 12 LRG (CLIP) ×1
APR CLP LRG 13.4X12 ROT 20 MLT (CLIP) ×1
BAG COUNTER SPONGE SURGICOUNT (BAG) IMPLANT
BAG SPNG CNTER NS LX DISP (BAG) ×1
BENZOIN TINCTURE PRP APPL 2/3 (GAUZE/BANDAGES/DRESSINGS) ×1 IMPLANT
BLADE SURG SZ11 CARB STEEL (BLADE) ×1 IMPLANT
BNDG ADH 1X3 SHEER STRL LF (GAUZE/BANDAGES/DRESSINGS) ×6 IMPLANT
BNDG ADH THN 3X1 STRL LF (GAUZE/BANDAGES/DRESSINGS) ×6
CABLE HIGH FREQUENCY MONO STRZ (ELECTRODE) IMPLANT
CHLORAPREP W/TINT 26 (MISCELLANEOUS) ×2 IMPLANT
CLIP APPLIE ROT 13.4 12 LRG (CLIP) IMPLANT
CLIP SUT LAPRA TY ABSORB (SUTURE) ×2 IMPLANT
COVER SURGICAL LIGHT HANDLE (MISCELLANEOUS) ×1 IMPLANT
DEVICE SUT QUICK LOAD TK 5 (SUTURE) IMPLANT
DEVICE SUT TI-KNOT TK 5X26 (SUTURE) IMPLANT
DEVICE SUTURE ENDOST 10MM (ENDOMECHANICALS) ×1 IMPLANT
DRAIN PENROSE 0.25X18 (DRAIN) ×1 IMPLANT
ELECT REM PT RETURN 15FT ADLT (MISCELLANEOUS) ×1 IMPLANT
GAUZE 4X4 16PLY ~~LOC~~+RFID DBL (SPONGE) ×1 IMPLANT
GAUZE SPONGE 4X4 12PLY STRL (GAUZE/BANDAGES/DRESSINGS) IMPLANT
GLOVE BIO SURGEON STRL SZ 6 (GLOVE) ×1 IMPLANT
GLOVE INDICATOR 6.5 STRL GRN (GLOVE) ×1 IMPLANT
GOWN STRL REUS W/ TWL LRG LVL3 (GOWN DISPOSABLE) ×1 IMPLANT
GOWN STRL REUS W/ TWL XL LVL3 (GOWN DISPOSABLE) IMPLANT
GOWN STRL REUS W/TWL LRG LVL3 (GOWN DISPOSABLE) ×1
GOWN STRL REUS W/TWL XL LVL3 (GOWN DISPOSABLE)
IRRIG SUCT STRYKERFLOW 2 WTIP (MISCELLANEOUS) ×1
IRRIGATION SUCT STRKRFLW 2 WTP (MISCELLANEOUS) ×1 IMPLANT
KIT BASIN OR (CUSTOM PROCEDURE TRAY) ×1 IMPLANT
KIT GASTRIC LAVAGE 34FR ADT (SET/KITS/TRAYS/PACK) ×1 IMPLANT
KIT TURNOVER KIT A (KITS) IMPLANT
MARKER SKIN DUAL TIP RULER LAB (MISCELLANEOUS) ×1 IMPLANT
MAT PREVALON FULL STRYKER (MISCELLANEOUS) ×1 IMPLANT
NDL SPNL 22GX3.5 QUINCKE BK (NEEDLE) ×1 IMPLANT
NEEDLE SPNL 22GX3.5 QUINCKE BK (NEEDLE) ×1
PACK CARDIOVASCULAR III (CUSTOM PROCEDURE TRAY) ×1 IMPLANT
PENCIL SMOKE EVACUATOR (MISCELLANEOUS) IMPLANT
RELOAD ENDO STITCH 2.0 (ENDOMECHANICALS) ×8
RELOAD STAPLE 60 2.6 WHT THN (STAPLE) ×2 IMPLANT
RELOAD STAPLE 60 3.6 BLU REG (STAPLE) ×2 IMPLANT
RELOAD STAPLE 60 3.8 GOLD REG (STAPLE) IMPLANT
RELOAD STAPLE 60 4.1 GRN THCK (STAPLE) IMPLANT
RELOAD SUT SNGL STCH ABSRB 2-0 (ENDOMECHANICALS) ×4 IMPLANT
RELOAD SUT SNGL STCH BLK 2-0 (ENDOMECHANICALS) ×4 IMPLANT
SCISSORS LAP 5X45 EPIX DISP (ENDOMECHANICALS) ×1 IMPLANT
SET TUBE SMOKE EVAC HIGH FLOW (TUBING) ×1 IMPLANT
SHEARS HARMONIC 45 ACE (MISCELLANEOUS) ×1 IMPLANT
SLEEVE ADV FIXATION 12X100MM (TROCAR) IMPLANT
SLEEVE ADV FIXATION 5X100MM (TROCAR) ×2 IMPLANT
SOLUTION ANTFG W/FOAM PAD STRL (MISCELLANEOUS) ×1 IMPLANT
STAPLER ECHELON BIOABSB 60 FLE (MISCELLANEOUS) IMPLANT
STAPLER ECHELON LONG 60 440 (INSTRUMENTS) ×1 IMPLANT
STAPLER RELOAD BLUE 60MM (STAPLE) ×2
STAPLER RELOAD GOLD 60MM (STAPLE)
STAPLER RELOAD GREEN 60MM (STAPLE)
STAPLER RELOAD WHITE 60MM (STAPLE) ×2
STRIP CLOSURE SKIN 1/2X4 (GAUZE/BANDAGES/DRESSINGS) ×1 IMPLANT
SUT MNCRL AB 4-0 PS2 18 (SUTURE) ×1 IMPLANT
SUT RELOAD ENDO STITCH 2 48X1 (ENDOMECHANICALS) ×4
SUT RELOAD ENDO STITCH 2.0 (ENDOMECHANICALS) ×4
SUT SURGIDAC NAB ES-9 0 48 120 (SUTURE) IMPLANT
SUT VIC AB 2-0 SH 27 (SUTURE) ×1
SUT VIC AB 2-0 SH 27X BRD (SUTURE) ×1 IMPLANT
SYR 20ML ECCENTRIC (SYRINGE) ×1 IMPLANT
SYR 20ML LL LF (SYRINGE) ×1 IMPLANT
TOWEL OR 17X26 10 PK STRL BLUE (TOWEL DISPOSABLE) ×1 IMPLANT
TOWEL OR NON WOVEN STRL DISP B (DISPOSABLE) ×1 IMPLANT
TRAY FOLEY MTR SLVR 16FR STAT (SET/KITS/TRAYS/PACK) ×1 IMPLANT
TROCAR ADV FIXATION 12X100MM (TROCAR) ×1 IMPLANT
TROCAR ADV FIXATION 5X100MM (TROCAR) ×1 IMPLANT
TROCAR XCEL NON-BLD 5MMX100MML (ENDOMECHANICALS) ×1 IMPLANT
TUBING CONNECTING 10 (TUBING) IMPLANT

## 2023-05-22 NOTE — Op Note (Signed)
Kim Barrett [id number] [birth year] 05/22/2023  Preoperative diagnosis: severe obesity  Postoperative diagnosis: Same   Procedure: Upper endoscopy   Surgeon: Atilano Ina M.D., FACS   Anesthesia: Gen.   Indications for procedure: 26 y.o. yo female undergoing a laparoscopic roux en y gastric bypass and an upper endoscopy was requested to evaluate the anastomosis.  Description of procedure: After we have completed the new gastrojejunostomy, I scrubbed out and obtained the Olympus endoscope. I gently placed endoscope in the patient's oropharynx and gently glided it down the esophagus without any difficulty under direct visualization. Once I was in the gastric pouch, I insufflated the pouch was air. The pouch was approximately 5.5 cm (zline at 39 cm, g-j at 45 cm) in size. I was able to cannulate and advanced the scope through the gastrojejunostomy. Dr.Connor had placed saline in the upper abdomen. Upon further insufflation of the gastric pouch there was no evidence of bubbles. Upon further inspection of the gastric pouch, the mucosa appeared normal. There is no evidence of any mucosal abnormality. The gastric pouch and Roux limb were decompressed. The width of the gastrojejunal anastomosis was at least 2.5 cm. The scope was withdrawn. The patient tolerated this portion of the procedure well. Please see Dr Derrill Memo operative note for details regarding the laparoscopic roux-en-y gastric bypass.  Kim Barrett. Andrey Campanile, MD, FACS General, Bariatric, & Minimally Invasive Surgery [facility], [place]

## 2023-05-22 NOTE — Progress Notes (Signed)
Patient seen in Short Stay (SS 5) with family prior to surgery.  Patient excited about surgery. Discussed QI "Goals for Discharge".  Also reviewed pain and nausea control.  BSTOP education provided including BSTOP information guide, "Guide for Pain Management after your Bariatric Procedure".  Diet progression education provided including "Bariatric Surgery Post-Op Food Plan Phase 1: Liquids".  No questions from the patient at this time. Will continue to partner with staff and follow up with patient.   Thank you,  Lubertha Basque, RN, MSN Bariatric Nurse Coordinator [phone] (office)

## 2023-05-22 NOTE — Anesthesia Procedure Notes (Signed)
Procedure Name: Intubation Date/Time: 05/22/2023 1:46 PM  Performed by: Maurene Capes, CRNAPre-anesthesia Checklist: Patient identified, Emergency Drugs available, Suction available and Patient being monitored Patient Re-evaluated:Patient Re-evaluated prior to induction Oxygen Delivery Method: Circle System Utilized Preoxygenation: Pre-oxygenation with 100% oxygen Induction Type: IV induction Ventilation: Mask ventilation without difficulty Laryngoscope Size: Mac and 4 Grade View: Grade II Tube type: Oral Tube size: 7.5 mm Number of attempts: 1 Placement Confirmation: ETT inserted through vocal cords under direct vision, positive ETCO2 and breath sounds checked- equal and bilateral Secured at: 22 cm Tube secured with: Tape Dental Injury: Teeth and Oropharynx as per pre-operative assessment

## 2023-05-22 NOTE — Progress Notes (Signed)
PHARMACY CONSULT FOR:  Risk Assessment for Post-Discharge VTE Following Bariatric Surgery  Post-Discharge VTE Risk Assessment: This patient's probability of 30-day post-discharge VTE is increased due to the factors marked:  Sleeve gastrectomy   Liver disorder (transplant, cirrhosis, or nonalcoholic steatohepatitis)   Hx of VTE   Hemorrhage requiring transfusion   GI perforation, leak, or obstruction   ====================================================    Female    Age >/=60 years   x BMI >/=50 kg/m2    CHF    Dyspnea at Rest    Paraplegia  x  Non-gastric-band surgery    Operation Time >/=3 hr    Return to OR     Length of Stay >/= 3 d   Hypercoagulable condition   Significant venous stasis      Predicted probability of 30-day post-discharge VTE:  0.27%  Other patient-specific factors to consider:   Recommendation for Discharge: No pharmacologic prophylaxis post-discharge    Kim Barrett is a 27 y.o. 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    Case start: 14:04 Case end: 15:54   Allergies  Allergen Reactions   Guaifenesin Hives and Swelling   Bee Venom Swelling   Robitussin 12 Hour Cough [Dextromethorphan Polistirex Er] Swelling    Patient Measurements: Height: 5' (152.4 cm) Weight: 123 kg (271 lb 3.2 oz) IBW/kg (Calculated) : 45.5 Body mass index is 52.97 kg/m.  No results for input(s): "WBC", "HGB", "HCT", "PLT", "APTT", "CREATININE", "LABCREA", "CREAT24HRUR", "MG", "PHOS", "ALBUMIN", "PROT", "AST", "ALT", "ALKPHOS", "BILITOT", "BILIDIR", "IBILI" in the last 72 hours. Estimated Creatinine Clearance: 129.8 mL/min (by C-G formula based on SCr of 0.61 mg/dL).    Past Medical History:  Diagnosis Date   Carpal tunnel syndrome    PCOS (polycystic ovarian syndrome)      Medications Prior to Admission  Medication Sig Dispense Refill Last Dose   acetaminophen (TYLENOL) 500 MG tablet Take 1,000 mg by mouth every 6  (six) hours as needed for moderate pain.      Cholecalciferol (VITAMIN D3 ULTRA STRENGTH) 125 MCG (5000 UT) capsule Take 5,000 Units by mouth daily.   Past Week   medroxyPROGESTERone (PROVERA) 5 MG tablet Take 1 tablet (5 mg total) by mouth daily. (Patient not taking: Reported on 05/08/2023) 20 tablet 0 Not Taking   norethindrone-ethinyl estradiol-iron (LOESTRIN FE 1.5/30) 1.5-30 MG-MCG tablet Take 1 tablet by mouth at bedtime. (Patient not taking: Reported on 05/08/2023) 28 tablet 3 Not Taking   VIORELE 0.15-0.02/0.01 MG (21/5) tablet TAKE 1 TABLET BY MOUTH EVERY NIGHT AT BEDTIME (Patient not taking: Reported on 05/08/2023) 28 tablet 2 Not Taking       Kim Barrett, Joselyn Glassman, PharmD

## 2023-05-22 NOTE — Op Note (Signed)
Operative Note  Kim Barrett  [id number]  [id number]  05/22/2023   Surgeon: Phylliss Blakes MD FACS   Assistant: Gaynelle Adu MD FACS   Procedure performed: laparoscopic Roux-en-Y gastric bypass (antecolic, antegastric) , upper endoscopy   Preop diagnosis: Morbid obesity Body mass index is 52.97 kg/m. Post-op diagnosis/intraop findings: same   Specimens: none Retained items: none  EBL: minimal cc Complications: none   Description of procedure: After obtaining informed consent and administration of prophylactic heparin in holding, the patient was taken to the operating room and placed supine on the operating room table where general endotracheal anesthesia was initiated, preoperative antibiotics were administered, SCDs applied, and a formal timeout was performed. The abdomen was prepped and draped in usual sterile fashion. Peritoneal access was gained using a Visiport technique in the left upper quadrant and insufflation to 15 mmHg ensued without issue. Gross inspection revealed no evidence of injury. Under direct visualization the remaining trochars were inserted. A laparoscopic assisted bilateral taps block was performed using Exparel mixed with 0.25% Marcaine with epinephrine.  The omentum was reflected cephalad and the ligament of Treitz identified. The small bowel was followed to a point 50 cm distal to ligament of Treitz at which location the bowel was divided with a white load linear cutting stapler. A Penrose was sutured to the Roux side of the staple line for future identification. The bowel was measured another 100 cm distal to this and and the site for the jejunojejunostomy was aligned with the end of the biliopancreatic limb. Enterotomies were made with the Harmonic scalpel and the anastomosis was created with the 60 mm white load linear cutting stapler. The common enterotomy was closed with running 3-0 Vicryl starting on either end and tying centrally. The mesenteric defect was  closed with running silk suture secured with Lapra-Ty's. The anastomosis was inspected and appeared widely patent, hemostatic with no gaps in the suture line. Vistaseal was sprayed over the anastomosis. We then divided the omentum using the harmonic scalpel.  The patient was then placed in steep reverse Trendelenburg. The liver retractor was inserted through a subxiphoid incision and secured for fixed retraction of the left lobe. The Harmonic scalpel was used to enter the perigastric plane and the lesser sac at a point 5 cm distal to the GE junction on the lesser curve. The angle of His was gently bluntly dissected and the target shape of the pouch visualized to exclude any residual fundus. After confirming that all tubes have been removed from the stomach, the gastric pouch was created with serial fires of the linear cutting stapler. As we approached the angle of His, the Ewald tube was inserted to confirm no impingement on the GE junction.  There was some bleeding along the staple line along the excluded stomach which was addressed with clips.   The Roux limb with its attached Penrose drain was then identified and brought up to meet the gastric pouch ensuring no twist in the small bowel mesentery. The staple line of the small bowel is directed to the patient's left side. A running 3-0 Vicryl was used to create a posterior suture line for our anastomosis between the gastric pouch and the small bowel. Gastrotomy and enterotomy was made with the Harmonic scalpel and a blue load linear cutting stapler was used to create a gastrojejunal anastomosis approximately 2.5cm wide. The common enterotomy was closed with running 3-0 Vicryl starting at either end and tying centrally. At this juncture the Ewald tube was passed through the  gastrojejunal anastomosis. An anterior second layer of running 3-0 Vicryl was used to complete the gastrojejunal anastomosis. The ewald tube was removed without difficulty. The Eagle space  was closed with a figure-of-eight silk suture.  At this point the assistant performed an upper endoscopy with the Roux limb gently clamped with a bowel clamp. Irrigation was instilled in the upper abdomen for a leak test which was negative. Please see his separate operative note- the anastomosis is noted to be patent, well-perfused and hemostatic without any leak or bubbles present. The endoscope was removed and the abdomen once again surveyed.  Additional Vistaseal was sprayed over the gastrojejunostomy.  The liver retractor was removed under direct visualization. The abdomen was then desufflated and all remaining trochars removed. The skin incisions were closed with subcuticular 4-0 Monocryl; benzoin, Steri-Strips and Band-Aids were applied The patient was then awakened, extubated and taken to PACU in stable condition.     All counts were correct at the completion of the case.

## 2023-05-22 NOTE — Transfer of Care (Signed)
Immediate Anesthesia Transfer of Care Note  Patient: Kim Barrett  Procedure(s) Performed: LAPAROSCOPIC ROUX-EN-Y GASTRIC BYPASS WITH UPPER ENDOSCOPY  Patient Location: PACU  Anesthesia Type:General  Level of Consciousness: oriented, drowsy, and patient cooperative  Airway & Oxygen Therapy: Patient Spontanous Breathing and Patient connected to face mask oxygen  Post-op Assessment: Report given to RN and Post -op Vital signs reviewed and stable  Post vital signs: Reviewed and stable  Last Vitals:  Vitals Value Taken Time  BP 130/77 05/22/23 1630  Temp 36.7 C 05/22/23 1620  Pulse 124 05/22/23 1631  Resp 21 05/22/23 1631  SpO2 99 % 05/22/23 1631  Vitals shown include unfiled device data.  Last Pain:  Vitals:   05/22/23 1620  TempSrc:   PainSc: 5          Complications: No notable events documented.

## 2023-05-22 NOTE — Interval H&P Note (Signed)
History and Physical Interval Note:  05/22/2023 12:17 PM  Kim Barrett  has presented today for surgery, with the diagnosis of MORBID OBESITY.  The various methods of treatment have been discussed with the patient and family. After consideration of risks, benefits and other options for treatment, the patient has consented to  Procedure(s): LAPAROSCOPIC ROUX-EN-Y GASTRIC BYPASS WITH UPPER ENDOSCOPY (N/A) as a surgical intervention.  The patient's history has been reviewed, patient examined, no change in status, stable for surgery.  I have reviewed the patient's chart and labs.  Questions were answered to the patient's satisfaction.     [name] Lollie Sails

## 2023-05-22 NOTE — Anesthesia Preprocedure Evaluation (Addendum)
Anesthesia Evaluation  Patient identified by MRN, date of birth, ID band Patient awake    Reviewed: Allergy & Precautions, NPO status , Patient's Chart, lab work & pertinent test results  History of Anesthesia Complications Negative for: history of anesthetic complications  Airway Mallampati: III  TM Distance: >3 FB Neck ROM: Full    Dental  (+) Dental Advisory Given, Teeth Intact   Pulmonary neg pulmonary ROS   Pulmonary exam normal        Cardiovascular negative cardio ROS Normal cardiovascular exam     Neuro/Psych  PSYCHIATRIC DISORDERS  Depression    negative neurological ROS     GI/Hepatic negative GI ROS, Neg liver ROS,,,  Endo/Other    Morbid obesity  Renal/GU negative Renal ROS     Musculoskeletal negative musculoskeletal ROS (+)    Abdominal  (+) + obese  Peds  Hematology negative hematology ROS (+)   Anesthesia Other Findings   Reproductive/Obstetrics  PCOS                              Anesthesia Physical Anesthesia Plan  ASA: 3  Anesthesia Plan: General   Post-op Pain Management: Tylenol PO (pre-op)* and Gabapentin PO (pre-op)*   Induction: Intravenous  PONV Risk Score and Plan: 3 and Treatment may vary due to age or medical condition, Ondansetron, Scopolamine patch - Pre-op, Midazolam and Dexamethasone  Airway Management Planned: Oral ETT  Additional Equipment: None  Intra-op Plan:   Post-operative Plan: Extubation in OR  Informed Consent: I have reviewed the patients History and Physical, chart, labs and discussed the procedure including the risks, benefits and alternatives for the proposed anesthesia with the patient or authorized representative who has indicated his/her understanding and acceptance.     Dental advisory given  Plan Discussed with: CRNA and Anesthesiologist  Anesthesia Plan Comments:         Anesthesia Quick Evaluation

## 2023-05-23 ENCOUNTER — Encounter (HOSPITAL_COMMUNITY): Payer: Self-pay | Admitting: Surgery

## 2023-05-23 LAB — CBC
HCT: 38.4 % (ref 36.0–46.0)
Hemoglobin: 12.1 g/dL (ref 12.0–15.0)
MCH: 27.7 pg (ref 26.0–34.0)
MCHC: 31.5 g/dL (ref 30.0–36.0)
MCV: 87.9 fL (ref 80.0–100.0)
Platelets: 285 10*3/uL (ref 150–400)
RBC: 4.37 MIL/uL (ref 3.87–5.11)
RDW: 14.4 % (ref 11.5–15.5)
WBC: 8.6 10*3/uL (ref 4.0–10.5)
nRBC: 0 % (ref 0.0–0.2)

## 2023-05-23 LAB — BASIC METABOLIC PANEL
Anion gap: 9 (ref 5–15)
BUN: 7 mg/dL (ref 6–20)
CO2: 21 mmol/L — ABNORMAL LOW (ref 22–32)
Calcium: 8.7 mg/dL — ABNORMAL LOW (ref 8.9–10.3)
Chloride: 106 mmol/L (ref 98–111)
Creatinine, Ser: 0.5 mg/dL (ref 0.44–1.00)
GFR, Estimated: >60 mL/min (ref >60)
Glucose, Bld: 124 mg/dL — ABNORMAL HIGH (ref 70–99)
Potassium: 4.1 mmol/L (ref 3.5–5.1)
Sodium: 136 mmol/L (ref 135–145)

## 2023-05-23 LAB — MAGNESIUM: Magnesium: 2.1 mg/dL (ref 1.7–2.4)

## 2023-05-23 MED ORDER — ONDANSETRON 4 MG PO TBDP: 4.0000 mg | ORAL_TABLET | Freq: Four times a day (QID) | ORAL | 0 refills | Status: DC | PRN

## 2023-05-23 MED ORDER — TRAMADOL HCL 50 MG PO TABS: 50.0000 mg | ORAL_TABLET | Freq: Four times a day (QID) | ORAL | 0 refills | Status: DC | PRN

## 2023-05-23 MED ORDER — PANTOPRAZOLE SODIUM 40 MG PO TBEC: 40.0000 mg | DELAYED_RELEASE_TABLET | Freq: Every day | ORAL | 0 refills | Status: DC

## 2023-05-23 MED ORDER — GABAPENTIN 100 MG PO CAPS: 100.0000 mg | ORAL_CAPSULE | Freq: Two times a day (BID) | ORAL | 0 refills | Status: DC

## 2023-05-23 MED ORDER — ACETAMINOPHEN 500 MG PO TABS: 1000.0000 mg | ORAL_TABLET | Freq: Three times a day (TID) | ORAL | Status: AC

## 2023-05-23 NOTE — Progress Notes (Signed)
   05/23/23 0941  TOC Brief Assessment  Insurance and Status Reviewed  Patient has primary care physician Yes  Home environment has been reviewed home alone with support from family as needed  Prior level of function: indepedent  Prior/Current Home Services No current home services  Social Determinants of Health Reivew SDOH reviewed no interventions necessary  Readmission risk has been reviewed Yes  Transition of care needs no transition of care needs at this time

## 2023-05-23 NOTE — Discharge Summary (Signed)
Physician Discharge Summary  Kim Barrett QMV:[id number] DOB: August 09, 1996 DOA: 05/22/2023  PCP: Armando Gang, FNP  Admit date: 05/22/2023 Discharge date: 05/23/2023   Recommendations for Outpatient Follow-up:     Follow-up Information     Berna Bue, MD Follow up.   Specialty: General Surgery Contact information: [address] [place] [address] [phone]                Discharge Diagnoses:  Principal Problem:   Morbid obesity (HCC) 282 lbs   Surgical Procedure: Laparoscopic Roux-en-Y gastric bypass, upper endoscopy  Discharge Condition: Good Disposition: Home  Diet recommendation: Postoperative gastric bypass diet  Filed Weights   05/22/23 1115 05/22/23 1124 05/22/23 1146  Weight: 124.3 kg 123 kg 123 kg     Hospital Course:  The patient was admitted for a planned laparoscopic Roux-en-Y gastric bypass. Please see operative note. Preoperatively the patient was given 5000 units of subcutaneous heparin for DVT prophylaxis. ERAS protocol was used. Postoperative prophylactic heparin dosing was started on the morning of postoperative day 1.  The patient was started on ice chips and water on the evening of POD 0 which they tolerated.  She had an uneventful night and reports minimal pain or nausea.  On postoperative day 1 The patient's diet was advanced to protein shakes which they also tolerated. The patient was ambulating without difficulty. Their vital signs are stable without fever or tachycardia. Their hemoglobin had remained stable.  The patient had received discharge instructions and counseling. They were deemed stable for discharge.  BP 125/88 (BP Location: Left Arm)   Pulse 83   Temp 98.2 F (36.8 C) (Oral)   Resp 15   Ht 5' (1.524 m)   Wt 123 kg   LMP 05/10/2023 Comment: Hcg 05/22/23  SpO2 98%   BMI 52.97 kg/m   Gen: alert, NAD, non-toxic appearing Pulm: Labored respirations, saturating well on room air CV: regular  rate and rhythm Abd: soft, nontender, nondistended.  Incisions are all clean and dry with Steri-Strips and Band-Aids.  No surrounding cellulitis, hematoma or ecchymosis.  No incisional hernia. Ext: no edema, no calf tenderness Skin: no rash, no jaundice  Discharge Instructions   Allergies as of 05/23/2023       Reactions   Guaifenesin Hives, Swelling   Bee Venom Swelling   Robitussin 12 Hour Cough [dextromethorphan Polistirex Er] Swelling        Medication List     STOP taking these medications    medroxyPROGESTERone 5 MG tablet Commonly known as: Provera   norethindrone-ethinyl estradiol-iron 1.5-30 MG-MCG tablet Commonly known as: Loestrin Fe 1.5/30   Viorele 0.15-0.02/0.01 MG (21/5) tablet Generic drug: desogestrel-ethinyl estradiol       TAKE these medications    acetaminophen 500 MG tablet Commonly known as: TYLENOL Take 1,000 mg by mouth every 6 (six) hours as needed for moderate pain. What changed: Another medication with the same name was added. Make sure you understand how and when to take each.   acetaminophen 500 MG tablet Commonly known as: TYLENOL Take 2 tablets (1,000 mg total) by mouth every 8 (eight) hours for 5 days. What changed: You were already taking a medication with the same name, and this prescription was added. Make sure you understand how and when to take each.   gabapentin 100 MG capsule Commonly known as: NEURONTIN Take 1 capsule (100 mg total) by mouth every 12 (twelve) hours for 5 days.   ondansetron 4 MG disintegrating  tablet Commonly known as: ZOFRAN-ODT Take 1 tablet (4 mg total) by mouth every 6 (six) hours as needed for nausea or vomiting.   pantoprazole 40 MG tablet Commonly known as: PROTONIX Take 1 tablet (40 mg total) by mouth daily. Take this medication daily regardless of reflux symptoms   traMADol 50 MG tablet Commonly known as: ULTRAM Take 1 tablet (50 mg total) by mouth every 6 (six) hours as needed (pain).    Vitamin D3 Ultra Strength 125 MCG (5000 UT) capsule Generic drug: Cholecalciferol Take 5,000 Units by mouth daily.        Follow-up Information     Berna Bue, MD Follow up.   Specialty: General Surgery Contact information: [address] [place] [address] [phone]                  The results of significant diagnostics from this hospitalization (including imaging, microbiology, ancillary and laboratory) are listed below for reference.    Significant Diagnostic Studies: No results found.  Labs: Basic Metabolic Panel: Recent Labs  Lab 05/17/23 1448 05/23/23 0444  NA 137 136  K 3.6 4.1  CL 102 106  CO2 23 21*  GLUCOSE 88 124*  BUN 15 7  CREATININE 0.61 0.50  CALCIUM 9.4 8.7*  MG  --  2.1   Liver Function Tests: Recent Labs  Lab 05/17/23 1448  AST 23  ALT 43  ALKPHOS 56  BILITOT 0.4  PROT 7.9  ALBUMIN 4.0    CBC: Recent Labs  Lab 05/17/23 1448 05/23/23 0444  WBC 7.0 8.6  NEUTROABS 4.5  --   HGB 13.0 12.1  HCT 40.9 38.4  MCV 87.4 87.9  PLT 303 285    CBG: No results for input(s): "GLUCAP" in the last 168 hours.  Principal Problem:   Morbid obesity (HCC) 282 lbs   Signed: Berna Bue  MD [facility] [phone] 05/23/2023, 9:26 AM

## 2023-05-23 NOTE — Progress Notes (Signed)
Patient alert and oriented, pain is controlled. Patient is tolerating fluids, advanced to protein shake today, patient is tolerating well. Reviewed Gastric sleeve/bypass discharge instructions with patient and patient is able to articulate understanding. Provided information on BELT program, Support Group, BSTOP-D, and [facility] outpatient pharmacy. Communicated general update of patient status to surgeon. All questions answered. 24hr fluid recall is 360 mL per hydration protocol, bariatric nurse coordinator to make follow-up phone call within one week.

## 2023-05-23 NOTE — Progress Notes (Signed)
AVS reviewed w/ pt & mother - both verbalized an understanding- This RN also reviewed the maximum amount of tylenol in a 24 hr period (4000mg )- pt verbalized an understanding. Pt 's PIV removed as charted . Pt to home w/ mother via w/c to car

## 2023-05-23 NOTE — Anesthesia Postprocedure Evaluation (Signed)
Anesthesia Post Note  Patient: Kim Barrett  Procedure(s) Performed: LAPAROSCOPIC ROUX-EN-Y GASTRIC BYPASS WITH UPPER ENDOSCOPY     Patient location during evaluation: PACU Anesthesia Type: General Level of consciousness: awake and alert Pain management: pain level controlled Vital Signs Assessment: post-procedure vital signs reviewed and stable Respiratory status: spontaneous breathing, nonlabored ventilation and respiratory function stable Cardiovascular status: stable and blood pressure returned to baseline Anesthetic complications: no   No notable events documented.  Last Vitals:  Vitals:   05/23/23 0530 05/23/23 0943  BP: 125/88 134/72  Pulse: 83 65  Resp: 15 18  Temp: 36.8 C 36.9 C  SpO2: 98% 99%                  Beryle Lathe

## 2023-05-23 NOTE — Plan of Care (Signed)
  Problem: Education: Goal: Knowledge of General Education information will improve Description: Including pain rating scale, medication(s)/side effects and non-pharmacologic comfort measures Outcome: Progressing   Problem: Health Behavior/Discharge Planning: Goal: Ability to manage health-related needs will improve Outcome: Progressing   Problem: Clinical Measurements: Goal: Ability to maintain clinical measurements within normal limits will improve Outcome: Progressing Goal: Will remain free from infection Outcome: Progressing Goal: Diagnostic test results will improve Outcome: Progressing Goal: Respiratory complications will improve Outcome: Progressing Goal: Cardiovascular complication will be avoided Outcome: Progressing   Problem: Activity: Goal: Risk for activity intolerance will decrease Outcome: Progressing   Problem: Nutrition: Goal: Adequate nutrition will be maintained Outcome: Progressing   Problem: Coping: Goal: Level of anxiety will decrease Outcome: Progressing   Problem: Elimination: Goal: Will not experience complications related to bowel motility Outcome: Progressing Goal: Will not experience complications related to urinary retention Outcome: Progressing   Problem: Pain Managment: Goal: General experience of comfort will improve Outcome: Progressing   Problem: Safety: Goal: Ability to remain free from injury will improve Outcome: Progressing   Problem: Skin Integrity: Goal: Risk for impaired skin integrity will decrease Outcome: Progressing   Problem: Education: Goal: Ability to state signs and symptoms to report to health care provider will improve Outcome: Progressing Goal: Knowledge of the prescribed self-care regimen will improve Outcome: Progressing Goal: Knowledge of discharge needs will improve Outcome: Progressing   Problem: Activity: Goal: Ability to tolerate increased activity will improve Outcome: Progressing   Problem:  Bowel/Gastric: Goal: Gastrointestinal status for postoperative course will improve Outcome: Progressing Goal: Occurrences of nausea will decrease Outcome: Progressing   Problem: Coping: Goal: Development of coping mechanisms to deal with changes in body function or appearance will improve Outcome: Progressing   Problem: Fluid Volume: Goal: Maintenance of adequate hydration will improve Outcome: Progressing   Problem: Nutritional: Goal: Nutritional status will improve Outcome: Progressing   Problem: Clinical Measurements: Goal: Will show no signs or symptoms of venous thromboembolism Outcome: Progressing Goal: Will remain free from infection Outcome: Progressing Goal: Will show no signs of GI Leak Outcome: Progressing   Problem: Respiratory: Goal: Will regain and/or maintain adequate ventilation Outcome: Progressing   Problem: Pain Management: Goal: Pain level will decrease Outcome: Progressing   Problem: Skin Integrity: Goal: Demonstration of wound healing without infection will improve Outcome: Progressing   

## 2023-05-25 ENCOUNTER — Telehealth: Payer: Self-pay | Admitting: Dietician

## 2023-05-25 NOTE — Telephone Encounter (Signed)
Pt called post bariatric surgery to ask about options on the full liquid diet. Dietitian reviewed full liquid option. Pt states she is increasing fluids each day, stating she is tolerating more each day and continues to push fluids. Pt express understanding and questions were answered to patients satisfaction.

## 2023-05-29 ENCOUNTER — Telehealth: Payer: Self-pay | Admitting: Dietician

## 2023-05-30 NOTE — Telephone Encounter (Signed)
Returned call from Kim Barrett regarding bariatric post-op diet. She is unable to tolerate calcium chews and would like to switch to tablet she can swallow. Advised her to discuss with surgeon to determine her readiness. She also asked about timing of diet advancement and foods she will be able to add in first stage. Provided basic instruction; she will attend post-op class on 06/05/23.

## 2023-05-31 ENCOUNTER — Other Ambulatory Visit: Payer: Self-pay | Admitting: Surgery

## 2023-05-31 ENCOUNTER — Telehealth (HOSPITAL_COMMUNITY): Payer: Self-pay | Admitting: *Deleted

## 2023-05-31 DIAGNOSIS — R2243 Localized swelling, mass and lump, lower limb, bilateral: Secondary | ICD-10-CM

## 2023-05-31 NOTE — Telephone Encounter (Signed)
1. Tell me about your pain and pain management?     Pt denies any pain at the time. Pt sometimes notices it off an on, but it has not been unbearable.    2. Let's talk about fluid intake. How much total fluid are you taking in?   Pt states that s/he is getting in at least 30oz of fluid including protein shakes, bottled water. Patient stated that she is trying, however she stated that she had not drank as much when she first wakes.  Recommended to pt to drink fluid upon waking and set timers if needed to stay hyrdrated.  Pt instructed to assess status and suggestions daily utilizing Hydration Action Plan on discharge folder and to call [facility] if in the "red zone".     3. How much protein have you taken in the last day?     Pt states she is working towards meeting the goal of 60g of protein each day with the protein shakes. Pt currently able to consume 30g of protein today    4. Have you had nausea? Tell me about when you have experienced nausea and what you did to help?   Pt denies nausea.   5. Has the frequency or color changed with your urine?   Pt states that s/he is urinating "fine" with no changes in frequency or urgency.   6. Tell me what your incisions look like?   "Incisions look fine". Pt denies a fever, chills. Pt states incisions are not swollen, open, or draining. Pt encouraged to call [facility] if incisions change.    7. Have you been passing gas? BM?   Pt states that they are having BMs. But did experience some constipation earlier. Pt instructed to take either Miralax or MoM as instructed per "Gastric Bypass/Sleeve Discharge Home Care Instructions". Pt to call surgeon's office if not able to have BM with medication.      8. If a problem or question were to arise who would you call? Do you know contact numbers for [facility], [facility], and [facility]?   Pt knows to call [facility] for surgical, [facility] for nutrition, and [facility] for non-urgent questions or concerns. Pt denies dehydration symptoms. Pt  can describe s/sx of dehydration.   9. How has the walking going?   Pt states s/he is walking around and able to be active without difficulty.   10. How are your vitamins and calcium going? How are you taking them?    Pt states that s/he is taking his/her supplements and vitamins without difficulty.

## 2023-06-01 ENCOUNTER — Ambulatory Visit
Admission: RE | Admit: 2023-06-01 | Discharge: 2023-06-01 | Disposition: A | Discharge status: Home / Self Care | Payer: BC Managed Care – PPO | Source: Ambulatory Visit | Attending: Surgery

## 2023-06-01 DIAGNOSIS — R2243 Localized swelling, mass and lump, lower limb, bilateral: Secondary | ICD-10-CM

## 2023-06-05 ENCOUNTER — Encounter: Payer: Self-pay | Admitting: Dietician

## 2023-06-05 ENCOUNTER — Encounter: Payer: BC Managed Care – PPO | Attending: Surgery | Admitting: Dietician

## 2023-06-05 VITALS — Ht 60.0 in | Wt 262.1 lb

## 2023-06-05 DIAGNOSIS — E669 Obesity, unspecified: Secondary | ICD-10-CM | POA: Diagnosis present

## 2023-06-05 NOTE — Progress Notes (Signed)
2 Week Post-Operative Nutrition Class   Patient was seen on 06/05/2023 for Post-Operative Nutrition education at [facility].    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 Also, OTC wt loss meds.  Medical hx: obesity Medications: birth control  Labs: LDL 106; Vit D 24.2; glucose 114; iron saturation 12; A1c 5.8 Notable signs/symptoms: none noted Any previous deficiencies? No Bowel Habits: Every day to every other day no complaints   Body Composition Scale 06/05/2023  Current Body Weight 262.1  Total Body Fat % 47.4  Visceral Fat 14  Fat-Free Mass % 52.5   Total Body Water % 40.7  Muscle-Mass lbs 30.5  BMI 51.0  Body Fat Displacement          Torso  lbs 77.2         Left Leg  lbs 15.4         Right Leg  lbs 15.4         Left Arm  lbs 7.7         Right Arm  lbs 7.7    The following the learning objectives were met by the patient during this course: Identifies Soft Prepped Plan Advancement Guide  Identifies Soft, High Proteins (Phase 1), beginning 2 weeks post-operatively to 3 weeks post-operatively Identifies Additional Soft High Proteins, soft non-starchy vegetables, fruits and starches (Phase 2), beginning 3 weeks post-operatively to 3 months post-operatively Identifies appropriate sources of fluids, proteins, vegetables, fruits and starches Identifies appropriate fat sources and healthy verses unhealthy fat types   States protein, vegetable, fruit and starch recommendations and appropriate sources post-operatively Identifies the need for appropriate texture modifications, mastication, and bite sizes when consuming solids Identifies appropriate  fat consumption and sources Identifies appropriate multivitamin and calcium sources post-operatively Describes the need for physical activity post-operatively and will follow MD recommendations States when to call healthcare provider regarding medication questions or post-operative complications   Handouts given during class include: Soft Prepped Plan Advancement Guide   Follow-Up Plan: Patient will follow-up at [facility] in 10 weeks for 3 month post-op nutrition visit for diet advancement per MD.

## 2023-06-11 ENCOUNTER — Telehealth: Payer: Self-pay | Admitting: Dietician

## 2023-06-11 NOTE — Telephone Encounter (Signed)
Patient called with specific questions about soft prep plan advancement at 3 weeks post op. Patient expressed understanding and questions were answered to patient's satisfaction.

## 2023-06-14 ENCOUNTER — Telehealth: Payer: Self-pay | Admitting: Dietician

## 2023-06-14 NOTE — Telephone Encounter (Signed)
RD called pt to verify fluid intake once starting soft, solid proteins 2 week post-bariatric surgery.   Daily Fluid intake: 48-52 oz and trying increase Daily Protein intake: 60 grams Bowel Habits: every few days, using Miralax every  Concerns/issues: questions were answered to patients satisfaction.

## 2023-06-21 ENCOUNTER — Telehealth: Payer: Self-pay | Admitting: Dietician

## 2023-06-21 NOTE — Telephone Encounter (Signed)
Pt called with questions about the soft prep plan foods. Pt express understanding and all questions were answered to patients satisfaction.

## 2023-07-11 ENCOUNTER — Encounter: Payer: Self-pay | Admitting: Obstetrics and Gynecology

## 2023-07-11 ENCOUNTER — Ambulatory Visit: Payer: BC Managed Care – PPO | Admitting: Obstetrics and Gynecology

## 2023-07-11 ENCOUNTER — Other Ambulatory Visit (HOSPITAL_COMMUNITY)
Admission: RE | Admit: 2023-07-11 | Discharge: 2023-07-11 | Disposition: A | Discharge status: Home / Self Care | Payer: BC Managed Care – PPO | Source: Ambulatory Visit | Attending: Obstetrics and Gynecology | Admitting: Obstetrics and Gynecology

## 2023-07-11 VITALS — BP 106/75 | HR 82 | Ht 60.0 in | Wt 251.4 lb

## 2023-07-11 DIAGNOSIS — E282 Polycystic ovarian syndrome: Secondary | ICD-10-CM

## 2023-07-11 DIAGNOSIS — Z01419 Encounter for gynecological examination (general) (routine) without abnormal findings: Secondary | ICD-10-CM

## 2023-07-11 DIAGNOSIS — Z124 Encounter for screening for malignant neoplasm of cervix: Secondary | ICD-10-CM | POA: Insufficient documentation

## 2023-07-11 DIAGNOSIS — Z3041 Encounter for surveillance of contraceptive pills: Secondary | ICD-10-CM

## 2023-07-11 DIAGNOSIS — Z113 Encounter for screening for infections with a predominantly sexual mode of transmission: Secondary | ICD-10-CM

## 2023-07-11 MED ORDER — LEVONORGEST-ETH ESTRAD 91-DAY 0.15-0.03 &0.01 MG PO TABS: 1.0000 | ORAL_TABLET | Freq: Every day | ORAL | 3 refills | Status: DC

## 2023-07-11 NOTE — Progress Notes (Signed)
Patients presents for annual exam today. She states regular monthly cycles that heavier, stopped OCP due to bariatric surgery but plans to restart in January. Due for pap smear, ordered. STD screening ordered. She states no other questions or concerns at this time.

## 2023-07-11 NOTE — Progress Notes (Signed)
HPI:      Kim Barrett is a 27 y.o. G0P0000 who LMP was Patient's last menstrual period was 06/16/2023.  Subjective:   She presents today for her annual examination.  She reports that she is doing well.  She stopped her OCPs because she had bariatric surgery and they advised that.  She would like to restart now.  Her surgery went well and she is already lost 37 pounds in 2 months.  She has a history of PCOS and is socially active at this time without birth control.    Hx: The following portions of the patient's history were reviewed and updated as appropriate:             She  has a past medical history of Carpal tunnel syndrome and PCOS (polycystic ovarian syndrome). She does not have any pertinent problems on file. 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 Her family history is not on file. She  reports that she has never smoked. She has never used smokeless tobacco. She reports that she does not currently use alcohol. She reports that she does not use drugs. She has a current medication list which includes the following prescription(s): pantoprazole, acetaminophen, vitamin d3 ultra strength, gabapentin, levonorgestrel-ethinyl estradiol, ondansetron, and tramadol. She is allergic to guaifenesin, bee venom, and robitussin 12 hour cough [dextromethorphan polistirex er].       Review of Systems:  Review of Systems  Constitutional: Denied constitutional symptoms, night sweats, recent illness, fatigue, fever, insomnia and weight loss.  Eyes: Denied eye symptoms, eye pain, photophobia, vision change and visual disturbance.  Ears/Nose/Throat/Neck: Denied ear, nose, throat or neck symptoms, hearing loss, nasal discharge, sinus congestion and sore throat.  Cardiovascular: Denied cardiovascular symptoms, arrhythmia, chest pain/pressure, edema, exercise intolerance, orthopnea and palpitations.  Respiratory: Denied  pulmonary symptoms, asthma, pleuritic pain, productive sputum, cough, dyspnea and wheezing.  Gastrointestinal: Denied, gastro-esophageal reflux, melena, nausea and vomiting.  Genitourinary: Denied genitourinary symptoms including symptomatic vaginal discharge, pelvic relaxation issues, and urinary complaints.  Musculoskeletal: Denied musculoskeletal symptoms, stiffness, swelling, muscle weakness and myalgia.  Dermatologic: Denied dermatology symptoms, rash and scar.  Neurologic: Denied neurology symptoms, dizziness, headache, neck pain and syncope.  Psychiatric: Denied psychiatric symptoms, anxiety and depression.  Endocrine: Denied endocrine symptoms including hot flashes and night sweats.   Meds:   Current Outpatient Medications on File Prior to Visit  Medication Sig Dispense Refill   pantoprazole (PROTONIX) 40 MG tablet Take 1 tablet (40 mg total) by mouth daily. Take this medication daily regardless of reflux symptoms 90 tablet 0   acetaminophen (TYLENOL) 500 MG tablet Take 1,000 mg by mouth every 6 (six) hours as needed for moderate pain. (Patient not taking: Reported on 07/11/2023)     Cholecalciferol (VITAMIN D3 ULTRA STRENGTH) 125 MCG (5000 UT) capsule Take 5,000 Units by mouth daily. (Patient not taking: Reported on 07/11/2023)     gabapentin (NEURONTIN) 100 MG capsule Take 1 capsule (100 mg total) by mouth every 12 (twelve) hours for 5 days. 10 capsule 0   ondansetron (ZOFRAN-ODT) 4 MG disintegrating tablet Take 1 tablet (4 mg total) by mouth every 6 (six) hours as needed for nausea or vomiting. (Patient not taking: Reported on 07/11/2023) 20 tablet 0   traMADol (ULTRAM) 50 MG tablet Take 1 tablet (50 mg total) by mouth every 6 (six) hours as needed (pain). (Patient not taking: Reported on 07/11/2023) 10 tablet 0   No current facility-administered  medications on file prior to visit.     Objective:     Vitals:   07/11/23 1014  BP: 106/75  Pulse: 82    Filed Weights    07/11/23 1014  Weight: 251 lb 6.4 oz (114 kg)              Physical examination General NAD, Conversant  HEENT Atraumatic; Op clear with mmm.  Normo-cephalic.  Anicteric sclerae  Thyroid/Neck Smooth without nodularity or enlargement. Normal ROM.  Neck Supple.  Skin No rashes, lesions or ulceration. Normal palpated skin turgor. No nodularity.  Breasts: No masses or discharge.  Symmetric.  No axillary adenopathy.  Lungs: Clear to auscultation.No rales or wheezes. Normal Respiratory effort, no retractions.  Heart: NSR.  No murmurs or rubs appreciated. No peripheral edema  Abdomen: Soft.  Non-tender.  No masses.  No HSM. No hernia  Extremities: Moves all appropriately.  Normal ROM for age. No lymphadenopathy.  Neuro: Oriented to PPT.  Normal mood. Normal affect.     Pelvic:   Vulva: Normal appearance.  No lesions.  Vagina: No lesions or abnormalities noted.  Support: Normal pelvic support.  Urethra No masses tenderness or scarring.  Meatus Normal size without lesions or prolapse.  Cervix: Normal appearance.  No lesions.  Anus: Normal exam.  No lesions.  Perineum: Normal exam.  No lesions.        Bimanual   Uterus: Normal size.  Non-tender.  Mobile.  AV.  Adnexae: No masses.  Non-tender to palpation.  Cul-de-sac: Negative for abnormality.     Assessment:    G0P0000 Patient Active Problem List   Diagnosis Date Noted   Morbid obesity (HCC) 282 lbs 02/22/2023   Depression, major, single episode, complete remission [facility]) dx'd age 71 02/22/2023   PCOS (polycystic ovarian syndrome) 04/12/2020     1. Well woman exam with routine gynecological exam   2. Cervical cancer screening   3. Screen for STD (sexually transmitted disease)   4. PCO (polycystic ovaries)   5. Surveillance for birth control, oral contraceptives     She would like to restart OCPs.   Plan:            1.  Basic Screening Recommendations The basic screening recommendations for asymptomatic women were  discussed with the patient during her visit.  The age-appropriate recommendations were discussed with her and the rational for the tests reviewed.  When I am informed by the patient that another primary care physician has previously obtained the age-appropriate tests and they are up-to-date, only outstanding tests are ordered and referrals given as necessary.  Abnormal results of tests will be discussed with her when all of her results are completed.  Routine preventative health maintenance measures emphasized: Exercise/Diet/Weight control, Tobacco Warnings, Alcohol/Substance use risks and Stress Management Pap performed-GC/CT. 2.  Restart OCPs  Orders Orders Placed This Encounter  Procedures   Hepatitis B surface antigen   Hepatitis C antibody   HIV Antibody (routine testing w rflx)   RPR     Meds ordered this encounter  Medications   Levonorgestrel-Ethinyl Estradiol (AMETHIA) 0.15-0.03 &0.01 MG tablet    Sig: Take 1 tablet by mouth at bedtime.    Dispense:  84 tablet    Refill:  3        F/U  Return in about 1 year (around 07/10/2024) for Annual Physical.  [name], M.D. 07/11/2023 10:49 AM

## 2023-07-12 LAB — HEPATITIS C ANTIBODY: Hep C Virus Ab: NONREACTIVE

## 2023-07-12 LAB — HEPATITIS B SURFACE ANTIGEN: Hepatitis B Surface Ag: NEGATIVE

## 2023-07-12 LAB — RPR: RPR Ser Ql: NONREACTIVE

## 2023-07-12 LAB — HIV ANTIBODY (ROUTINE TESTING W REFLEX): HIV Screen 4th Generation wRfx: NONREACTIVE

## 2023-07-18 ENCOUNTER — Other Ambulatory Visit: Payer: Self-pay

## 2023-07-18 DIAGNOSIS — B9689 Other specified bacterial agents as the cause of diseases classified elsewhere: Secondary | ICD-10-CM

## 2023-07-18 LAB — CYTOLOGY - PAP
Chlamydia: NEGATIVE
Comment: NEGATIVE
Comment: NEGATIVE
Comment: NORMAL
Diagnosis: NEGATIVE
Neisseria Gonorrhea: NEGATIVE
Trichomonas: NEGATIVE

## 2023-07-18 MED ORDER — METRONIDAZOLE 0.75 % VA GEL: 1.0000 | Freq: Every day | VAGINAL | 1 refills | Status: DC

## 2023-08-23 ENCOUNTER — Telehealth: Payer: Self-pay

## 2023-08-23 ENCOUNTER — Encounter: Payer: Self-pay | Admitting: Dietician

## 2023-08-23 ENCOUNTER — Encounter: Payer: 59 | Attending: Surgery | Admitting: Dietician

## 2023-08-23 DIAGNOSIS — E669 Obesity, unspecified: Secondary | ICD-10-CM | POA: Insufficient documentation

## 2023-08-23 NOTE — Progress Notes (Signed)
 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 Also, OTC wt loss meds.  Medical hx: obesity Medications: birth control  Labs: LDL 106; Vit D 24.2; glucose 114; iron saturation 12; A1c 5.8 Notable signs/symptoms: none noted Any previous deficiencies? No Bowel Habits: Every day to every other day no complaints   Body Composition Scale 06/05/2023  Current Body Weight 262.1  Total Body Fat % 47.4  Visceral Fat 14  Fat-Free Mass % 52.5   Total Body Water  % 40.7  Muscle-Mass lbs 30.5  BMI 51.0  Body Fat Displacement          Torso  lbs 77.2         Left Leg  lbs 15.4         Right Leg  lbs 15.4         Left Arm  lbs 7.7         Right Arm  lbs 7.7    Lifestyle & Dietary Hx  Pt states she usually will eat broccoli, green beans or cabbage, stating she usually has some vegetables during the day. Pt states she is tired of eggs and will usually have a protein shake for breakfast. Pt states she weighs herself every Tuesday morning after using the toilet, stating she will only weight on her scale at home.  Estimated daily fluid intake: 64 oz Estimated daily protein intake: 60 g (not tracking) Supplements: multivitamin and calcium Current average weekly physical activity: walking and stairs (ADLs)   24-Hr Dietary Recall First Meal: protein shake Snack: turkey pepperoni with grapes  Second Meal: yogurt Snack:   Third Meal: pork stew with cabbage Snack: turkey pepperoni with grapes Beverages: water , water  with flavorings  Post-Op Goals/ Signs/  Symptoms Using straws: yes Drinking while eating: no Chewing/swallowing difficulties: no Changes in vision: no Changes to mood/headaches: no Hair loss/changes to skin/nails: no Difficulty focusing/concentrating: no Sweating: no Limb weakness: no Dizziness/lightheadedness: no Palpitations: no  Carbonated/caffeinated beverages: no N/V/D/C/Gas: a little constipation Abdominal pain: no Dumping syndrome: no   NUTRITION DIAGNOSIS  Overweight/obesity ([place]-3.3) related to past poor dietary habits and physical inactivity as evidenced by completed bariatric surgery and following dietary guidelines for continued weight loss and healthy nutrition status.   NUTRITION INTERVENTION Nutrition counseling (C-1) and education (E-2) to facilitate bariatric surgery goals, including: Diet advancement to the standard prep plan The importance of consuming adequate calories as well as certain nutrients daily due to the body's need for essential vitamins, minerals, and fats The importance of daily physical activity and to reach a goal of at least 150 minutes of moderate to vigorous physical activity weekly (or as directed by their physician) due to benefits such as increased musculature and improved lab values The importance of intuitive eating specifically learning hunger-satiety cues and understanding the importance of learning a new body: The importance of mindful eating to avoid grazing behaviors   Goals Increase physical activity: aim for 45 minutes, 3-4 days per week, at gym (cardio and strength) to start; increase to 60 minutes as tolerated/desired.  Handouts Provided Include  Standard Prep Plan  Advancement Guide  Learning Style & Readiness for Change Teaching method utilized: Visual & Auditory  Demonstrated degree of understanding via: Teach Back  Readiness Level: preparation Barriers to learning/adherence to lifestyle change: nothing identified  RD's Notes for Next Visit Assess adherence to pt  chosen goals  MONITORING & EVALUATION Dietary intake, weekly physical activity, body weight.  Next Steps Patient is to follow-up in 3 months for 6 month post-op follow-up/class.

## 2023-08-23 NOTE — Telephone Encounter (Signed)
 Pt returned phone call on triage. Pt states she had bariatric surgery and was off [facility] for 2 months. Has started her BC again, been on it for about 1 month. She is sex active and has not been taken care of herself, advised to take a UPT first thing in the morning and follow up with us  via mychart. I talked with ABC and she advised irregular bleeding is normal after significant weight loss. Pt aware and will f/u as needed.

## 2023-08-23 NOTE — Telephone Encounter (Signed)
 No DPR on file. Left voicemail to return call. Will send my chart message.

## 2023-08-23 NOTE — Telephone Encounter (Signed)
 TRIAGE VOICEMAIL: Patient states today is day 11 of bleeding. It's her second month on this pack of Levonorgestrel-Ethinyl Estradiol  (AMETHIA) 0.15-0.03 &0.01 MG tablet . She states her OCP has been changed multiple times. She's tired of bleeding. Inquiring what can be done.

## 2023-11-20 ENCOUNTER — Encounter: Payer: 59 | Attending: Surgery | Admitting: Skilled Nursing Facility1

## 2023-11-21 ENCOUNTER — Encounter: Payer: Self-pay | Admitting: Skilled Nursing Facility1

## 2023-11-21 NOTE — Progress Notes (Signed)
 Follow-up visit:  Post-Operative RYGB Surgery  Medical Nutrition Therapy:  Appt start time: 6:00pm end time: 6:45 pm  Primary concerns today: Post-operative Bariatric Surgery Nutrition Management 6 Month Post-Op Class  Anthropometrics  Start weight at [facility]: 281.0 lbs (date: 03/15/2023)  Height: 60 in Weight today:  195   Clinical   Pharmacotherapy: History of weight loss medication used: wegovy, Mounjaro; also stated she was prescribed something many years ago, but states she doesn't remember what it was. Also, OTC wt loss meds.  Medical hx: obesity Medications: birth control  Labs: LDL 106; Vit D 24.2; glucose 114; iron saturation 12; A1c 5.8 Notable signs/symptoms: none noted Any previous deficiencies? No Bowel Habits: Every day to every other day no complaints   Body Composition Scale 06/05/2023 11/20/2023  Current Body Weight 262.1 195  Total Body Fat % 47.4 39.6  Visceral Fat 14 9  Fat-Free Mass % 52.5 60.3   Total Body Water % 40.7 44.6  Muscle-Mass lbs 30.5 30.2  BMI 51.0 37.3  Body Fat Displacement           Torso  lbs 77.2 47.8         Left Leg  lbs 15.4 9.5         Right Leg  lbs 15.4 9.5         Left Arm  lbs 7.7 4.7         Right Arm  lbs 7.7 4.7    Information Reviewed/ Discussed During Appointment: -Review of composition scale numbers -Fluid requirements (64-100 ounces) -Protein requirements (60-80g) -Strategies for tolerating diet -Advancement of diet to include Starchy vegetables -Barriers to inclusion of new foods -Inclusion of appropriate multivitamin and calcium supplements  -Exercise recommendations   Fluid intake: adequate   Medications: See List Supplementation: appropriate   Using straws: no Drinking while eating: no Having you been chewing well: yes Chewing/swallowing difficulties: no Changes in vision: no Changes to mood/headaches: no Hair loss/Cahnges to skin/Changes to nails: no Any difficulty focusing or concentrating:  no Sweating: no Dizziness/Lightheaded: no Palpitations: no  Carbonated beverages: no N/V/D/C/GAS: no Abdominal Pain: no Dumping syndrome: no  Recent physical activity:  ADL's  Progress Towards Goal(s):  In Progress Teaching method utilized: Visual & Auditory  Demonstrated degree of understanding via: Teach Back  Readiness Level: Action Barriers to learning/adherence to lifestyle change: none identified  Handouts given during visit include: Phase V diet Progression  Goals Sheet The Benefits of Exercise are endless..... Support Group Topics   Teaching Method Utilized:  Visual Auditory Hands on  Demonstrated degree of understanding via:  Teach Back   Monitoring/Evaluation:  Dietary intake, exercise, and body weight. Follow up in 3 months for 9 month post-op visit.

## 2023-12-06 ENCOUNTER — Encounter: Payer: Self-pay | Admitting: Obstetrics and Gynecology

## 2023-12-06 DIAGNOSIS — Z3041 Encounter for surveillance of contraceptive pills: Secondary | ICD-10-CM

## 2023-12-11 ENCOUNTER — Telehealth (INDEPENDENT_AMBULATORY_CARE_PROVIDER_SITE_OTHER): Admitting: Obstetrics and Gynecology

## 2023-12-11 ENCOUNTER — Encounter: Payer: Self-pay | Admitting: Obstetrics and Gynecology

## 2023-12-11 DIAGNOSIS — N926 Irregular menstruation, unspecified: Secondary | ICD-10-CM

## 2023-12-11 DIAGNOSIS — N921 Excessive and frequent menstruation with irregular cycle: Secondary | ICD-10-CM

## 2023-12-11 NOTE — Progress Notes (Signed)
 Virtual Visit via Video Note  I connected with Kim Barrett on 12/11/23 at  7:35 AM EDT by video and verified that I was speaking with the correct person using two identifiers.    [name] is a 28 y.o. G0P0000 who LMP was No LMP recorded. I discussed the limitations, risks, security and privacy concerns of performing an evaluation and management service by video and the availability of in person appointments. I also discussed with the patient that there may be a patient responsible charge related to this service. The patient expressed understanding and agreed to proceed.  Location of patient: Work  Patient gave explicit verbal consent for video visit:  YES  Location of provider:  [facility] office  Persons other than physician and patient involved in provider conference:  None   Subjective:   History of Present Illness:    She presents today stating that she has experienced breakthrough bleeding in her second pack of Seasonique.  She has part way through the second pack and she has had some bleeding which seems like a period right in the middle.  She did miss 1 pill and the period started the next day.  She doubled up but this did not help she continued to bleed. Of significant note, she has had bariatric surgery in the past and has lost approximately 100 pounds to this point.  Hx: The following portions of the patient's history were reviewed and updated as appropriate:             She  has a past medical history of Carpal tunnel syndrome and PCOS (polycystic ovarian syndrome). She does not have any pertinent problems on file. 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 Her family history is not on file. She  reports that she has never smoked. She has never used smokeless tobacco. She reports that she does not currently use alcohol. She reports that she does not use drugs. She has a current medication list  which includes the following prescription(s): acetaminophen , vitamin d3 ultra strength, gabapentin , levonorgestrel-ethinyl estradiol , metronidazole , ondansetron , pantoprazole , and tramadol . She is allergic to guaifenesin, bee venom, and robitussin 12 hour cough [dextromethorphan polistirex er].       Review of Systems:  Review of Systems  Constitutional: Denied constitutional symptoms, night sweats, recent illness, fatigue, fever, insomnia and weight loss.  Eyes: Denied eye symptoms, eye pain, photophobia, vision change and visual disturbance.  Ears/Nose/Throat/Neck: Denied ear, nose, throat or neck symptoms, hearing loss, nasal discharge, sinus congestion and sore throat.  Cardiovascular: Denied cardiovascular symptoms, arrhythmia, chest pain/pressure, edema, exercise intolerance, orthopnea and palpitations.  Respiratory: Denied pulmonary symptoms, asthma, pleuritic pain, productive sputum, cough, dyspnea and wheezing.  Gastrointestinal: Denied, gastro-esophageal reflux, melena, nausea and vomiting.  Genitourinary: Breakthrough bleeding on OCPs  Musculoskeletal: Denied musculoskeletal symptoms, stiffness, swelling, muscle weakness and myalgia.  Dermatologic: Denied dermatology symptoms, rash and scar.  Neurologic: Denied neurology symptoms, dizziness, headache, neck pain and syncope.  Psychiatric: Denied psychiatric symptoms, anxiety and depression.  Endocrine: Denied endocrine symptoms including hot flashes and night sweats.   Meds:   Current Outpatient Medications on File Prior to Visit  Medication Sig Dispense Refill   acetaminophen  (TYLENOL ) 500 MG tablet Take 1,000 mg by mouth every 6 (six) hours as needed for moderate pain. (Patient not taking: Reported on 07/11/2023)     Cholecalciferol (VITAMIN D3 ULTRA STRENGTH) 125 MCG (5000 UT) capsule Take 5,000 Units by mouth daily. (  Patient not taking: Reported on 07/11/2023)     gabapentin  (NEURONTIN ) 100 MG capsule Take 1 capsule (100 mg  total) by mouth every 12 (twelve) hours for 5 days. 10 capsule 0   Levonorgestrel-Ethinyl Estradiol  (AMETHIA) 0.15-0.03 &0.01 MG tablet Take 1 tablet by mouth at bedtime. 84 tablet 3   metroNIDAZOLE  (METROGEL ) 0.75 % vaginal gel Place 1 Applicatorful vaginally at bedtime. Apply one applicatorful to vagina at bedtime for 5 days 70 g 1   ondansetron  (ZOFRAN -ODT) 4 MG disintegrating tablet Take 1 tablet (4 mg total) by mouth every 6 (six) hours as needed for nausea or vomiting. (Patient not taking: Reported on 07/11/2023) 20 tablet 0   pantoprazole  (PROTONIX ) 40 MG tablet Take 1 tablet (40 mg total) by mouth daily. Take this medication daily regardless of reflux symptoms 90 tablet 0   traMADol  (ULTRAM ) 50 MG tablet Take 1 tablet (50 mg total) by mouth every 6 (six) hours as needed (pain). (Patient not taking: Reported on 07/11/2023) 10 tablet 0   No current facility-administered medications on file prior to visit.    Assessment:    G0P0000 Patient Active Problem List   Diagnosis Date Noted   Morbid obesity (HCC) 282 lbs 02/22/2023   Depression, major, single episode, complete remission [facility]) dx'd age 22 02/22/2023   PCOS (polycystic ovarian syndrome) 04/12/2020     1. Irregular menstruation   2. Breakthrough bleeding on OCPs     She is generally taking her pills correctly but is having some breakthrough bleeding.  She has had some bleeding in the middle of her second 73-month pack.  Plan:            1.  We have discussed options for this breakthrough bleeding.  She will do a little bit better at taking all of her pills and taking them in a timely manner.  Expectant management for breakthrough bleeding until she is in the next 47-month pack.  If she continues to experience bleeding would consider changing to a monthly OCP.  All questions answered.  Orders No orders of the defined types were placed in this encounter.   No orders of the defined types were placed in this encounter.      F/U  Return for Annual Physical.   Delice Felt, M.D. 12/11/2023 8:07 AM

## 2023-12-11 NOTE — Progress Notes (Deleted)
 HPI:      [name] is a 28 y.o. G0P0000 who LMP was No LMP recorded.  Subjective:   She presents today stating that she has experienced breakthrough bleeding in her second pack of Seasonique.  She has part way through the second pack and she has had some bleeding which seems like a period right in the middle.  She did miss 1 pill and the period started the next day.  She doubled up but this did not help she continued to bleed. Of significant note, she has had bariatric surgery in the past and has lost approximately 100 pounds to this point.    Hx: The following portions of the patient's history were reviewed and updated as appropriate:             She  has a past medical history of Carpal tunnel syndrome and PCOS (polycystic ovarian syndrome). She does not have any pertinent problems on file. 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 Her family history is not on file. She  reports that she has never smoked. She has never used smokeless tobacco. She reports that she does not currently use alcohol. She reports that she does not use drugs. She has a current medication list which includes the following prescription(s): acetaminophen , vitamin d3 ultra strength, gabapentin , levonorgestrel-ethinyl estradiol , metronidazole , ondansetron , pantoprazole , and tramadol . She is allergic to guaifenesin, bee venom, and robitussin 12 hour cough [dextromethorphan polistirex er].       Review of Systems:  Review of Systems  Constitutional: Denied constitutional symptoms, night sweats, recent illness, fatigue, fever, insomnia and weight loss.  Eyes: Denied eye symptoms, eye pain, photophobia, vision change and visual disturbance.  Ears/Nose/Throat/Neck: Denied ear, nose, throat or neck symptoms, hearing loss, nasal discharge, sinus congestion and sore throat.  Cardiovascular: Denied cardiovascular symptoms, arrhythmia, chest  pain/pressure, edema, exercise intolerance, orthopnea and palpitations.  Respiratory: Denied pulmonary symptoms, asthma, pleuritic pain, productive sputum, cough, dyspnea and wheezing.  Gastrointestinal: Denied, gastro-esophageal reflux, melena, nausea and vomiting.  Genitourinary: Denied genitourinary symptoms including symptomatic vaginal discharge, pelvic relaxation issues, and urinary complaints.  Musculoskeletal: Denied musculoskeletal symptoms, stiffness, swelling, muscle weakness and myalgia.  Dermatologic: Denied dermatology symptoms, rash and scar.  Neurologic: Denied neurology symptoms, dizziness, headache, neck pain and syncope.  Psychiatric: Denied psychiatric symptoms, anxiety and depression.  Endocrine: Denied endocrine symptoms including hot flashes and night sweats.   Meds:   Current Outpatient Medications on File Prior to Visit  Medication Sig Dispense Refill   acetaminophen  (TYLENOL ) 500 MG tablet Take 1,000 mg by mouth every 6 (six) hours as needed for moderate pain. (Patient not taking: Reported on 07/11/2023)     Cholecalciferol (VITAMIN D3 ULTRA STRENGTH) 125 MCG (5000 UT) capsule Take 5,000 Units by mouth daily. (Patient not taking: Reported on 07/11/2023)     gabapentin  (NEURONTIN ) 100 MG capsule Take 1 capsule (100 mg total) by mouth every 12 (twelve) hours for 5 days. 10 capsule 0   Levonorgestrel-Ethinyl Estradiol  (AMETHIA) 0.15-0.03 &0.01 MG tablet Take 1 tablet by mouth at bedtime. 84 tablet 3   metroNIDAZOLE  (METROGEL ) 0.75 % vaginal gel Place 1 Applicatorful vaginally at bedtime. Apply one applicatorful to vagina at bedtime for 5 days 70 g 1   ondansetron  (ZOFRAN -ODT) 4 MG disintegrating tablet Take 1 tablet (4 mg total) by mouth every 6 (six) hours as needed for nausea or vomiting. (Patient not taking: Reported on 07/11/2023) 20 tablet 0  pantoprazole  (PROTONIX ) 40 MG tablet Take 1 tablet (40 mg total) by mouth daily. Take this medication daily regardless of  reflux symptoms 90 tablet 0   traMADol  (ULTRAM ) 50 MG tablet Take 1 tablet (50 mg total) by mouth every 6 (six) hours as needed (pain). (Patient not taking: Reported on 07/11/2023) 10 tablet 0   No current facility-administered medications on file prior to visit.      Objective:     There were no vitals filed for this visit. There were no vitals filed for this visit.                      Assessment:    G0P0000 Patient Active Problem List   Diagnosis Date Noted   Morbid obesity (HCC) 282 lbs 02/22/2023   Depression, major, single episode, complete remission [facility]) dx'd age 21 02/22/2023   PCOS (polycystic ovarian syndrome) 04/12/2020     1. Irregular menstruation     She is having breakthrough bleeding on OCPs.   She is generally taking the pills correctly.  She rarely misses pills.   Plan:            1.   We have discussed options regarding changing OCPs versus continuing on these.  She has chosen at this time to continue on these pills.  Timing of pills discussed.  If she changes her mind and would like to switch to monthly pill we can do that.  She will let us  know if she has any further issues that she would like to discuss. Orders No orders of the defined types were placed in this encounter.   No orders of the defined types were placed in this encounter.     F/U  Return for Annual Physical.  Delice Felt, M.D. 12/11/2023 7:52 AM

## 2023-12-25 MED ORDER — DESOGESTREL-ETHINYL ESTRADIOL 0.15-0.02/0.01 MG (21/5) PO TABS: 1.0000 | ORAL_TABLET | Freq: Every day | ORAL | 2 refills | Status: DC

## 2024-02-11 ENCOUNTER — Encounter: Payer: Self-pay | Admitting: Obstetrics and Gynecology

## 2024-02-19 ENCOUNTER — Ambulatory Visit: Admitting: Dietician

## 2024-04-16 ENCOUNTER — Other Ambulatory Visit: Payer: Self-pay | Admitting: Obstetrics and Gynecology

## 2024-04-16 DIAGNOSIS — Z3041 Encounter for surveillance of contraceptive pills: Secondary | ICD-10-CM

## 2024-06-13 ENCOUNTER — Encounter: Payer: Self-pay | Admitting: Emergency Medicine

## 2024-06-13 ENCOUNTER — Other Ambulatory Visit: Payer: Self-pay

## 2024-06-13 DIAGNOSIS — R1010 Upper abdominal pain, unspecified: Secondary | ICD-10-CM | POA: Diagnosis present

## 2024-06-13 DIAGNOSIS — K8050 Calculus of bile duct without cholangitis or cholecystitis without obstruction: Secondary | ICD-10-CM | POA: Diagnosis not present

## 2024-06-13 LAB — URINALYSIS, ROUTINE W REFLEX MICROSCOPIC
Bacteria, UA: NONE SEEN
Bilirubin Urine: NEGATIVE
Glucose, UA: NEGATIVE mg/dL
Hgb urine dipstick: NEGATIVE
Ketones, ur: 20 mg/dL — AB
Nitrite: NEGATIVE
Protein, ur: NEGATIVE mg/dL
Specific Gravity, Urine: 1.019 (ref 1.005–1.030)
pH: 6 (ref 5.0–8.0)

## 2024-06-13 LAB — COMPREHENSIVE METABOLIC PANEL WITH GFR
ALT: 19 U/L (ref 0–44)
AST: 21 U/L (ref 15–41)
Albumin: 3.9 g/dL (ref 3.5–5.0)
Alkaline Phosphatase: 56 U/L (ref 38–126)
Anion gap: 11 (ref 5–15)
BUN: 15 mg/dL (ref 6–20)
CO2: 23 mmol/L (ref 22–32)
Calcium: 9 mg/dL (ref 8.9–10.3)
Chloride: 101 mmol/L (ref 98–111)
Creatinine, Ser: 0.59 mg/dL (ref 0.44–1.00)
GFR, Estimated: >60 mL/min (ref >60)
Glucose, Bld: 148 mg/dL — ABNORMAL HIGH (ref 70–99)
Potassium: 3.4 mmol/L — ABNORMAL LOW (ref 3.5–5.1)
Sodium: 135 mmol/L (ref 135–145)
Total Bilirubin: 0.6 mg/dL (ref 0.0–1.2)
Total Protein: 7.5 g/dL (ref 6.5–8.1)

## 2024-06-13 LAB — LIPASE, BLOOD: Lipase: 42 U/L (ref 11–51)

## 2024-06-13 LAB — CBC
HCT: 41.5 % (ref 36.0–46.0)
Hemoglobin: 14.3 g/dL (ref 12.0–15.0)
MCH: 31.6 pg (ref 26.0–34.0)
MCHC: 34.5 g/dL (ref 30.0–36.0)
MCV: 91.8 fL (ref 80.0–100.0)
Platelets: 263 K/uL (ref 150–400)
RBC: 4.52 MIL/uL (ref 3.87–5.11)
RDW: 12.2 % (ref 11.5–15.5)
WBC: 7.5 K/uL (ref 4.0–10.5)
nRBC: 0 % (ref 0.0–0.2)

## 2024-06-13 LAB — POC URINE PREG, ED: Preg Test, Ur: NEGATIVE

## 2024-06-13 MED ORDER — ONDANSETRON 4 MG PO TBDP
4.0000 mg | ORAL_TABLET | Freq: Once | ORAL | Status: AC
  Administered 2024-06-13: 4 mg via ORAL
  Filled 2024-06-13: qty 1

## 2024-06-13 MED ORDER — OXYCODONE-ACETAMINOPHEN 5-325 MG PO TABS
1.0000 | ORAL_TABLET | ORAL | Status: DC | PRN
  Administered 2024-06-13: 1 via ORAL
  Filled 2024-06-13: qty 1

## 2024-06-13 NOTE — ED Triage Notes (Signed)
 Pt arrives POV, ambulatory, gait steady, no acute distress noted c/o upper abd pain and left mid quadrant pain w/ n/v that started about 4 hrs pta after eating. 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  Denies pmh.

## 2024-06-14 ENCOUNTER — Emergency Department
Admission: EM | Admit: 2024-06-14 | Discharge: 2024-06-14 | Disposition: A | Discharge status: Home / Self Care | Attending: Emergency Medicine | Admitting: Emergency Medicine

## 2024-06-14 ENCOUNTER — Emergency Department

## 2024-06-14 DIAGNOSIS — K8050 Calculus of bile duct without cholangitis or cholecystitis without obstruction: Secondary | ICD-10-CM

## 2024-06-14 DIAGNOSIS — K8020 Calculus of gallbladder without cholecystitis without obstruction: Secondary | ICD-10-CM

## 2024-06-14 MED ORDER — ACETAMINOPHEN 325 MG PO TABS
650.0000 mg | ORAL_TABLET | Freq: Once | ORAL | Status: AC
  Administered 2024-06-14: 650 mg via ORAL
  Filled 2024-06-14: qty 2

## 2024-06-14 MED ORDER — ONDANSETRON 4 MG PO TBDP: ORAL_TABLET | ORAL | 0 refills | Status: DC

## 2024-06-14 MED ORDER — HYDROCODONE-ACETAMINOPHEN 5-325 MG PO TABS: 2.0000 | ORAL_TABLET | Freq: Four times a day (QID) | ORAL | 0 refills | Status: DC | PRN

## 2024-06-14 MED ORDER — MORPHINE SULFATE (PF) 4 MG/ML IV SOLN
4.0000 mg | Freq: Once | INTRAVENOUS | Status: AC
  Administered 2024-06-14: 4 mg via INTRAVENOUS
  Filled 2024-06-14: qty 1

## 2024-06-14 MED ORDER — OXYCODONE-ACETAMINOPHEN 5-325 MG PO TABS
1.0000 | ORAL_TABLET | Freq: Once | ORAL | Status: AC
  Administered 2024-06-14: 1 via ORAL
  Filled 2024-06-14: qty 1

## 2024-06-14 MED ORDER — KETOROLAC TROMETHAMINE 30 MG/ML IJ SOLN
15.0000 mg | Freq: Once | INTRAMUSCULAR | Status: AC
  Administered 2024-06-14: 15 mg via INTRAVENOUS
  Filled 2024-06-14: qty 1

## 2024-06-14 NOTE — ED Provider Notes (Signed)
 [facility] Provider Note    Event Date/Time   First MD Initiated Contact with Patient 06/14/24 [phone]     (approximate)   History   Abdominal Pain   HPI Kim Barrett is a 28 y.o. female with a history about a year ago of gastric bypass surgery who presents for evaluation of acute onset upper abdominal pain associate with nausea and vomiting.  It occurred just after she finished eating dinner.  Nothing has made it better and is very uncomfortable and she cannot find a position of comfort.  Because of her recent gastric bypass, she was concerned it might be a complication associated with the procedure.  She has never been told that she has gallstones but she has not had a cholecystectomy.  She does not drink alcohol.  She was having a normal day until this happened.  No lower abdominal pain and no dysuria.  No chest pain or shortness of breath.     Physical Exam   Triage Vital Signs: ED Triage Vitals  Encounter Vitals Group     BP 06/13/24 2237 129/73     Girls Systolic BP Percentile --      Girls Diastolic BP Percentile --      Boys Systolic BP Percentile --      Boys Diastolic BP Percentile --      Pulse Rate 06/13/24 2237 (!) 104     Resp 06/13/24 2237 18     Temp 06/13/24 2237 97.6 F (36.4 C)     Temp Source 06/13/24 2237 Oral     SpO2 06/13/24 2237 99 %     Weight --      Height --      Head Circumference --      Peak Flow --      Pain Score 06/13/24 2240 10     Pain Loc --      Pain Education --      Exclude from Growth Chart --     Most recent vital signs: Vitals:   06/13/24 2237 06/14/24 0207  BP: 129/73 110/76  Pulse: (!) 104 85  Resp: 18 20  Temp: 97.6 F (36.4 C)   SpO2: 99% 98%    General: Awake, alert, appears uncomfortable but nontoxic. CV:  Good peripheral perfusion.  Regular rate and rhythm. Resp:  Normal effort. Speaking easily and comfortably, no accessory muscle usage nor intercostal retractions.    Abd:  Abdomen is soft and nondistended.  She has epigastric and right upper quadrant tenderness with positive Murphy sign.  No lower abdominal tenderness.   ED Results / Procedures / Treatments   Labs (all labs ordered are listed, but only abnormal results are displayed) Labs Reviewed  COMPREHENSIVE METABOLIC PANEL WITH GFR - Abnormal; Notable for the following components:      Result Value   Potassium 3.4 (*)    Glucose, Bld 148 (*)    All other components within normal limits  URINALYSIS, ROUTINE W REFLEX MICROSCOPIC - Abnormal; Notable for the following components:   Color, Urine YELLOW (*)    APPearance CLEAR (*)    Ketones, ur 20 (*)    Leukocytes,Ua SMALL (*)    All other components within normal limits  LIPASE, BLOOD  CBC  POC URINE PREG, ED      RADIOLOGY I independently viewed and interpreted the patient's ultrasound and she has cholelithiasis but I do not appreciate any gallbladder wall thickening or bili cholecystic fluid.  Radiology  confirms cholelithiasis without evidence of cholecystitis.   PROCEDURES:  Critical Care performed: No  Procedures    IMPRESSION / MDM / ASSESSMENT AND PLAN / ED COURSE  I reviewed the triage vital signs and the nursing notes.                              Differential diagnosis includes, but is not limited to, biliary colic/gallbladder disease, pancreatitis, SBO, ileus, or other complication of gastric bypass.  Patient's presentation is most consistent with acute presentation with potential threat to life or bodily function.  Labs/studies ordered: CMP, lipase, CBC, urinalysis, urine pregnancy test, right upper quadrant ultrasound  Interventions/Medications given:  Medications  oxyCODONE -acetaminophen  (PERCOCET/ROXICET) 5-325 MG per tablet 1 tablet (has no administration in time range)  acetaminophen  (TYLENOL ) tablet 650 mg (has no administration in time range)  ondansetron  (ZOFRAN -ODT) disintegrating tablet 4 mg (4 mg  Oral Given 06/13/24 2246)  morphine (PF) 4 MG/ML injection 4 mg (4 mg Intravenous Given 06/14/24 0133)  ketorolac (TORADOL) 30 MG/ML injection 15 mg (15 mg Intravenous Given 06/14/24 0133)    (Note:  hospital course my include additional interventions and/or labs/studies not listed above.)   Strongly suspect biliary colic, providing morphine 4 mg IV, Toradol 15 mg IV, Zofran  4 mg IV and proceeded with evaluation.  Labs are all essentially normal including no LFT elevation.  Ultrasound consistent with cholelithiasis without cholecystitis as documented above.  Will reassess after medication and see if she is appropriate for discharge and outpatient follow-up.     Clinical Course as of 06/14/24 0325  Sat Jun 14, 2024  0324 US  ABDOMEN LIMITED RUQ (LIVER/GB) I independently viewed and interpreted the patient's ultrasound and I see gallstones with no pericholecystic fluid or gallbladder wall thickening.  Radiologist confirms cholelithiasis without evidence of cholecystitis. [CF]  0324 Reassessed patient.  She reports still having some pain but when I palpate her she has no clinically significant tenderness to palpation with no guarding.  Given her overall reassuring workup, no evidence of biliary obstruction, improvement in symptoms, etc., she is appropriate for discharge and outpatient follow-up.  I had my usual and customary biliary colic discussion with the patient and stressed the importance of outpatient follow-up.  Medications as listed below.  I gave my usual follow-up recommendations and return precautions. [CF]    Clinical Course User Index [CF] Gordan Huxley, MD     FINAL CLINICAL IMPRESSION(S) / ED DIAGNOSES   Final diagnoses:  Biliary colic  Calculus of gallbladder without cholecystitis without obstruction     Rx / DC Orders   ED Discharge Orders          Ordered    HYDROcodone-acetaminophen  (NORCO/VICODIN) 5-325 MG tablet  Every 6 hours PRN        06/14/24 0323     ondansetron  (ZOFRAN -ODT) 4 MG disintegrating tablet        06/14/24 0323             Note:  This document was prepared using Dragon voice recognition software and may include unintentional dictation errors.   Gordan Huxley, MD 06/14/24 [phone]

## 2024-06-14 NOTE — Discharge Instructions (Signed)
You have been seen in the Emergency Department (ED) for abdominal pain.  Your evaluation suggests that your pain is caused by gallstones.  Fortunately you do not need immediate surgery at this time, but it is important that you follow up with a surgeon as an outpatient; typically surgical removal of the gallbladder is the only thing that will definitively fix your issue.  Read through the included information about a bland diet, and use any prescribed medications as instructed.  Avoid smoking and alcohol use. ? ?Please follow up as instructed above regarding today?s emergent visit and the symptoms that are bothering you. ? ?Take Norco as prescribed. Do not drink alcohol, drive or participate in any other potentially dangerous activities while taking this medication as it may make you sleepy. Do not take this medication with any other sedating medications, either prescription or over-the-counter. If you were prescribed Percocet or Vicodin, do not take these with acetaminophen (Tylenol) as it is already contained within these medications. ?  ?This medication is an opiate (or narcotic) pain medication and can be habit forming.  Use it as little as possible to achieve adequate pain control.  Do not use or use it with extreme caution if you have a history of opiate abuse or dependence.  If you are on a pain contract with your primary care doctor or a pain specialist, be sure to let them know you were prescribed this medication today from the [facility].  This medication is intended for your use only - do not give any to anyone else and keep it in a secure place where nobody else, especially children, have access to it.  It will also cause or worsen constipation, so you may want to consider taking an over-the-counter stool softener while you are taking this medication. ? ?Return to the ED if your abdominal pain worsens or fails to improve, you develop bloody vomiting, bloody diarrhea, you are  unable to tolerate fluids due to vomiting, fever greater than 101, or other symptoms that concern you. ? ?

## 2024-06-14 NOTE — ED Notes (Signed)
 US  at bedside.

## 2024-07-01 ENCOUNTER — Ambulatory Visit: Payer: Self-pay | Admitting: General Surgery

## 2024-07-01 ENCOUNTER — Telehealth: Payer: Self-pay | Admitting: General Surgery

## 2024-07-01 ENCOUNTER — Encounter: Payer: Self-pay | Admitting: General Surgery

## 2024-07-01 ENCOUNTER — Ambulatory Visit: Admitting: General Surgery

## 2024-07-01 VITALS — BP 124/73 | HR 96 | Ht 60.0 in | Wt 132.0 lb

## 2024-07-01 DIAGNOSIS — K8050 Calculus of bile duct without cholangitis or cholecystitis without obstruction: Secondary | ICD-10-CM

## 2024-07-01 NOTE — Patient Instructions (Signed)
 You have requested to have your gallbladder removed. This will be done at [facility] with Dr. Maurine Minister.  If you are on any injectable weight loss medication, you will need to stop taking your GLP-1 injectable (weight loss) medications 8 days before your surgery to avoid any complications with anesthesia.   You will most likely be out of work 1-2 weeks for this surgery.  If you have FMLA or disability paperwork that needs filled out you may drop this off at our office or this can be faxed to [phone].  You will return after your post-op appointment with a lifting restriction for approximately 4 more weeks.  You will be able to eat anything you would like to following surgery. But, start by eating a bland diet and advance this as tolerated. The Gallbladder diet is below, please go as closely by this diet as possible prior to surgery to avoid any further attacks.  Please see the (blue)pre-care form that you have been given today. Our surgery scheduler will call you to verify surgery date and to go over information.   If you have any questions, please call our office.  Laparoscopic Cholecystectomy Laparoscopic cholecystectomy is surgery to remove the gallbladder. The gallbladder is located in the upper right part of the abdomen, behind the liver. It is a storage sac for bile, which is produced in the liver. Bile aids in the digestion and absorption of fats. Cholecystectomy is often done for inflammation of the gallbladder (cholecystitis). This condition is usually caused by a buildup of gallstones (cholelithiasis) in the gallbladder. Gallstones can block the flow of bile, and that can result in inflammation and pain. In severe cases, emergency surgery may be required. If emergency surgery is not required, you will have time to prepare for the procedure. Laparoscopic surgery is an alternative to open surgery. Laparoscopic surgery has a shorter recovery time. Your common bile duct may also need  to be examined during the procedure. If stones are found in the common bile duct, they may be removed. LET [facility] CARE PROVIDER KNOW ABOUT: Any allergies you have. All medicines you are taking, including vitamins, herbs, eye drops, creams, and over-the-counter medicines. Previous problems you or members of your family have had with the use of anesthetics. Any blood disorders you have. Previous surgeries you have had.  Any medical conditions you have. RISKS AND COMPLICATIONS Generally, this is a safe procedure. However, problems may occur, including: Infection. Bleeding. Allergic reactions to medicines. Damage to other structures or organs. A stone remaining in the common bile duct. A bile leak from the cyst duct that is clipped when your gallbladder is removed. The need to convert to open surgery, which requires a larger incision in the abdomen. This may be necessary if your surgeon thinks that it is not safe to continue with a laparoscopic procedure. BEFORE THE PROCEDURE Ask your health care provider about: Changing or stopping your regular medicines. This is especially important if you are taking diabetes medicines or blood thinners. Taking medicines such as aspirin and ibuprofen. These medicines can thin your blood. Do not take these medicines before your procedure if your health care provider instructs you not to. Follow instructions from your health care provider about eating or drinking restrictions. Let your health care provider know if you develop a cold or an infection before surgery. Plan to have someone take you home after the procedure. Ask your health care provider how your surgical site will be marked or identified. You may  be given antibiotic medicine to help prevent infection. PROCEDURE To reduce your risk of infection: Your health care team will wash or sanitize their hands. Your skin will be washed with soap. An IV tube may be inserted into one of your  veins. You will be given a medicine to make you fall asleep (general anesthetic). A breathing tube will be placed in your mouth. The surgeon will make several small cuts (incisions) in your abdomen. A thin, lighted tube (laparoscope) that has a tiny camera on the end will be inserted through one of the small incisions. The camera on the laparoscope will send a picture to a TV screen (monitor) in the operating room. This will give the surgeon a good view inside your abdomen. A gas will be pumped into your abdomen. This will expand your abdomen to give the surgeon more room to perform the surgery. Other tools that are needed for the procedure will be inserted through the other incisions. The gallbladder will be removed through one of the incisions. After your gallbladder has been removed, the incisions will be closed with stitches (sutures), staples, or skin glue. Your incisions may be covered with a bandage (dressing). The procedure may vary among health care providers and hospitals. AFTER THE PROCEDURE Your blood pressure, heart rate, breathing rate, and blood oxygen level will be monitored often until the medicines you were given have worn off. You will be given medicines as needed to control your pain.   This information is not intended to replace advice given to you by your health care provider. Make sure you discuss any questions you have with your health care provider.   Document Released: 07/31/2005 Document Revised: 04/21/2015 Document Reviewed: 03/12/2013 Elsevier Interactive Patient Education 2016 Else[employer] Inc.   Low-Fat Diet for Gallbladder Conditions A low-fat diet can be helpful if you have pancreatitis or a gallbladder condition. With these conditions, your pancreas and gallbladder have trouble digesting fats. A healthy eating plan with less fat will help rest your pancreas and gallbladder and reduce your symptoms. WHAT DO I NEED TO KNOW ABOUT THIS DIET? Eat a low-fat  diet. Reduce your fat intake to less than 20-30% of your total daily calories. This is less than 50-60 g of fat per day. Remember that you need some fat in your diet. Ask your dietician what your daily goal should be. Choose nonfat and low-fat healthy foods. Look for the words "nonfat," "low fat," or "fat free." As a guide, look on the label and choose foods with less than 3 g of fat per serving. Eat only one serving. Avoid alcohol. Do not smoke. If you need help quitting, talk with your health care provider. Eat small frequent meals instead of three large heavy meals. WHAT FOODS CAN I EAT? Grains Include healthy grains and starches such as potatoes, wheat bread, fiber-rich cereal, and brown rice. Choose whole grain options whenever possible. In adults, whole grains should account for 45-65% of your daily calories.  Fruits and Vegetables Eat plenty of fruits and vegetables. Fresh fruits and vegetables add fiber to your diet. Meats and Other Protein Sources Eat lean meat such as chicken and pork. Trim any fat off of meat before cooking it. Eggs, fish, and beans are other sources of protein. In adults, these foods should account for 10-35% of your daily calories. Dairy Choose low-fat milk and dairy options. Dairy includes fat and protein, as well as calcium.  Fats and Oils Limit high-fat foods such as fried foods, sweets, baked  goods, sugary drinks.  Other Creamy sauces and condiments, such as mayonnaise, can add extra fat. Think about whether or not you need to use them, or use smaller amounts or low fat options. WHAT FOODS ARE NOT RECOMMENDED? High fat foods, such as: [employer]. Ice cream. [place] toast. Sweet rolls. Pizza. Cheese bread. Foods covered with batter, butter, creamy sauces, or cheese. Fried foods. Sugary drinks and desserts. Foods that cause gas or bloating   This information is not intended to replace advice given to you by your health care provider. Make sure you  discuss any questions you have with your health care provider.   Document Released: 08/05/2013 Document Reviewed: 08/05/2013 Elsevier Interactive Patient Education [employer]

## 2024-07-01 NOTE — Progress Notes (Signed)
 Patient ID: Kim Barrett, female   DOB: [birth year], 28 y.o.   MRN: [id number] CC: Biliary Colic History of Present Illness Kim Barrett is a 28 y.o. 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  The patient reports that last week she developed epigastric and right upper quadrant pain.  She says that this was right after eating.  She says that she felt nauseated and took some Zofran  that she had but this did not help.  The pain persisted so she came to the emergency department.  There she was found to have normal labs and an ultrasound that showed cholelithiasis.  She denies any other previous history of pain like this.  She denies any problems with her bypass including no hernias or marginal ulcers..  Past Medical History Past Medical History:  Diagnosis Date   Carpal tunnel syndrome    PCOS (polycystic ovarian syndrome)        Past Surgical History:  Procedure Laterality Date   GASTRIC ROUX-EN-Y N/A 05/22/2023   Procedure: LAPAROSCOPIC ROUX-EN-Y GASTRIC BYPASS WITH UPPER ENDOSCOPY;  Surgeon: Signe Mitzie LABOR, MD;  Location: [facility];  Service: General;  Laterality: N/A;   WISDOM TOOTH EXTRACTION      Allergies  Allergen Reactions   Guaifenesin Hives and Swelling   Bee Venom Swelling   Robitussin 12 Hour Cough [Dextromethorphan Polistirex Er] Swelling    Current Outpatient Medications  Medication Sig Dispense Refill   sertraline (ZOLOFT) 50 MG tablet Take 50 mg by mouth daily.     No current facility-administered medications for this visit.    Family History History reviewed. No pertinent family history.     Social History Social History   Tobacco Use   Smoking status: Never   Smokeless tobacco: Never  Vaping Use   Vaping status: Never Used  Substance Use Topics   Alcohol use: Not Currently    Comment: last use 02/15/23 1-2x/mo   Drug use: No        ROS Full ROS  of systems performed and is otherwise negative there than what is stated in the HPI  Physical Exam Blood pressure 124/73, pulse 96, height 5' (1.524 m), weight 132 lb (59.9 kg), last menstrual period 06/05/2024, SpO2 98%.  Alert and oriented x 3, normal work of breathing room air, regular rate and rhythm, abdomen soft, laparoscopic port incisions are well-healed, mild tenderness in the epigastrium without any rebound tenderness or guarding, no jaundice  Data Reviewed Labs reviewed and significant for a normal total bilirubin.  Ultrasound that shows small gallstones within the gallbladder lumen  I have personally reviewed the patient's imaging and medical records.    Assessment    Patient with biliary colic  Plan    I recommended undergoing robotic assisted cholecystectomy.  I discussed the risk, benefits alternatives of the procedure including risk of infection, bleeding, bile leak, retained stone, injury to common bile duct, need for open procedure.  She understands these risks and wishes to proceed with surgery.    [name] 07/01/2024, 3:01 PM

## 2024-07-01 NOTE — Telephone Encounter (Signed)
 Patient has been advised of Pre-Admission date/time, and Surgery date at [facility].  Surgery Date: 08/20/24 Preadmission Testing Date: Preadmissions to call patient with appointment   Patient informed of the scheduling process and surgery information given at time of office visit.  Patient has been made aware to call [phone], between 1-3:00pm the day before surgery, to find out what time to arrive for surgery.

## 2024-07-28 ENCOUNTER — Other Ambulatory Visit: Payer: Self-pay

## 2024-07-28 DIAGNOSIS — Z3041 Encounter for surveillance of contraceptive pills: Secondary | ICD-10-CM

## 2024-07-28 MED ORDER — DESOGESTREL-ETHINYL ESTRADIOL 0.15-0.02/0.01 MG (21/5) PO TABS: 1.0000 | ORAL_TABLET | Freq: Every day | ORAL | 0 refills | Status: AC

## 2024-07-28 NOTE — Progress Notes (Signed)
 One refill authorized until Annual exam 09/03/23.

## 2024-08-04 ENCOUNTER — Encounter: Payer: Self-pay | Admitting: Dietician

## 2024-08-04 ENCOUNTER — Encounter: Attending: Surgery | Admitting: Dietician

## 2024-08-04 ENCOUNTER — Encounter
Admission: RE | Admit: 2024-08-04 | Discharge: 2024-08-04 | Disposition: A | Discharge status: Home / Self Care | Source: Ambulatory Visit | Attending: General Surgery | Admitting: General Surgery

## 2024-08-04 ENCOUNTER — Other Ambulatory Visit: Payer: Self-pay

## 2024-08-04 VITALS — Ht 60.0 in | Wt 133.3 lb

## 2024-08-04 DIAGNOSIS — E669 Obesity, unspecified: Secondary | ICD-10-CM | POA: Diagnosis present

## 2024-08-04 DIAGNOSIS — K8050 Calculus of bile duct without cholangitis or cholecystitis without obstruction: Secondary | ICD-10-CM | POA: Insufficient documentation

## 2024-08-04 DIAGNOSIS — Z01818 Encounter for other preprocedural examination: Secondary | ICD-10-CM | POA: Diagnosis present

## 2024-08-04 HISTORY — DX: Calculus of bile duct without cholangitis or cholecystitis without obstruction: K80.50

## 2024-08-04 NOTE — Progress Notes (Signed)
 Bariatric Nutrition Follow-Up Visit Medical Nutrition Therapy  Appt Start Time: 0807   End Time: 0850  Surgery date: 05/22/2023 Surgery type: RYGB  NUTRITION ASSESSMENT  Anthropometrics  Start weight at [facility]: 281.0 lbs (date: 03/15/2023)  Height: 60 in Weight today:  133.3 lbs   Clinical   Pharmacotherapy: History of weight loss medication used: wegovy, Mounjaro; also stated she was prescribed something many years ago, but states she doesn't remember what it was. Also, OTC wt loss meds.  Medical hx: obesity Medications: birth control  Labs: LDL 106; Vit D 24.2; glucose 114; iron saturation 12; A1c 5.8 Notable signs/symptoms: none noted Any previous deficiencies? No Bowel Habits: pt states she uses Miralax almost every day.   Body Composition Scale 06/05/2023 11/20/2023 08/04/2024  Current Body Weight 262.1 195 133.3  Total Body Fat % 47.4 39.6 26.4  Visceral Fat 14 9 5   Fat-Free Mass % 52.5 60.3 73.5   Total Body Water  % 40.7 44.6 51.2  Muscle-Mass lbs 30.5 30.2 29.2  BMI 51.0 37.3 25.7  Body Fat Displacement            Torso  lbs 77.2 47.8 21.7         Left Leg  lbs 15.4 9.5 4.3         Right Leg  lbs 15.4 9.5 4.3         Left Arm  lbs 7.7 4.7 2.1         Right Arm  lbs 7.7 4.7 2.1    Lifestyle & Dietary Hx Pt states she has maintain this weight for about a month. Pt states she feels good. Pt states her body has changed, but states her mind has not. Pt states she gets on the scale in the morning to decide what to have for breakfast. Pt states she still feels like she is big, stating she was posing for a picture, making a comment about her size, and her family reminded her of the opposite these days. Pt states she has taken it easy, stating she is getting her gall bladder out next month. Pt states she wants to do pilates after ball bladder removal. Pt states sometimes she is hungry and all she can think about is food. Pt states she does have some constipation, stating  she uses Miralax almost every day.  Estimated daily fluid intake: 80 oz Estimated daily protein intake: 60 g. or a little more Supplements: multivitamin and sometimes takes calcium Current average weekly physical activity: ADL  24-Hr Dietary Recall First Meal: protein coffee or smoothie (with banana or other frozen fruit) Snack: turkey pepperoni with grapes  Second Meal: salad Snack:  crackers and cheese or pork rinds Third Meal: chicken, rice, beans Snack: popcorn or sun chips and yogurt Beverages: water , water  with flavorings   NUTRITION DIAGNOSIS  Overweight/obesity (Kaleva-3.3) related to past poor dietary habits and physical inactivity as evidenced by completed bariatric surgery and following dietary guidelines for continued weight loss and healthy nutrition status.   NUTRITION INTERVENTION Nutrition counseling (C-1) and education (E-2) to facilitate bariatric surgery goals, including: The importance of consuming adequate calories as well as certain nutrients daily due to the body's need for essential vitamins, minerals, and fats The importance of daily physical activity and to reach a goal of at least 150 minutes of moderate to vigorous physical activity weekly (or as directed by their physician) due to benefits such as increased musculature and improved lab values The importance of intuitive eating specifically learning hunger-satiety  cues and understanding the importance of learning a new body: The importance of mindful eating to avoid grazing behaviors  After bariatric surgery, increasing non-starchy vegetables is a gentle way to add fiber, vitamins, and meal volume without causing discomfort. Vegetables like leafy greens, zucchini, peppers, cucumbers, cauliflower, and broccoli help support digestion and keep you satisfied longer while still fitting into smaller portion sizes. As your tolerance improves, gradually adding these vegetables alongside your protein can help create balanced,  nutrient-dense meals that support long-term success. Why you need complex carbohydrates: Whole grains and other complex carbohydrates are required to have a healthy diet. Whole grains provide fiber which can help with blood glucose levels and help keep you satiated. Fruits and starchy vegetables provide essential vitamins and minerals required for immune function, eyesight support, brain support, bone density, wound healing and many other functions within the body. According to the current evidenced based 2020-2025 Dietary Guidelines for Americans, complex carbohydrates are part of a healthy eating pattern which is associated with a decreased risk for type 2 diabetes, cancers, and cardiovascular disease.   Goals Re-engage: increase physical activity: aim for 45 minutes pilates, 3 days per week, and brisk walk 2 days per week for 40 minutes around the track near home. New: make appointment with counselor or dietitian to discuss body image and preoccupation with food.  Handouts Provided Include  Maintenance Plan (1 year)  Learning Style & Readiness for Change Teaching method utilized: Visual & Auditory  Demonstrated degree of understanding via: Teach Back Readiness Level: preparation Barriers to learning/adherence to lifestyle change: nothing identified  RD's Notes for Next Visit Assess adherence to pt chosen goals  MONITORING & EVALUATION Dietary intake, weekly physical activity, body weight.  Next Steps Patient is to follow-up in one month, after gall bladder removal.

## 2024-08-04 NOTE — Patient Instructions (Addendum)
 Your procedure is scheduled on: [facility] 08/21/23 Report to the Registration Desk on the 1st floor of the Medical Mall.  To find out your arrival time, please call [phone] between 1PM - 3PM on: TUESDAY 08/20/23 If your arrival time is 6:00 am, do not arrive before that time as the Medical Mall entrance doors do not open until 6:00 am.  REMEMBER: Instructions that are not followed completely may result in serious medical risk, up to and including death; or upon the discretion of your surgeon and anesthesiologist your surgery may need to be rescheduled.  Do not eat food after midnight the night before surgery.  No gum chewing or hard candies.  You may however, drink WATER  up to 2 hours before you are scheduled to arrive for your surgery. Do not drink anything within 2 hours of your scheduled arrival time.  One week prior to surgery:  Stop Anti-inflammatories (NSAIDS) such as Advil, Aleve, Ibuprofen, Motrin, Naproxen, Naprosyn and Aspirin based products such as Excedrin, Goody's Powder, BC Powder. Stop ANY OVER THE COUNTER supplements until after surgery.   You may however, continue to take Tylenol  if needed for pain up until the day of surgery.  Continue taking all of your other prescription medications up until the day of surgery.  ON THE DAY OF SURGERY ONLY TAKE THESE MEDICATIONS WITH SIPS OF WATER :  sertraline (ZOLOFT)  acetaminophen  (TYLENOL ) * if needed  No Alcohol for 24 hours before or after surgery.  No Smoking including e-cigarettes for 24 hours before surgery.  No chewable tobacco products for at least 6 hours before surgery.  No nicotine patches on the day of surgery.  Do not use any recreational drugs for at least a week (preferably 2 weeks) before your surgery.  Please be advised that the combination of cocaine and anesthesia may have negative outcomes, up to and including death. If you test positive for cocaine, your surgery will be cancelled.  On the morning of  surgery brush your teeth with toothpaste and water , you may rinse your mouth with mouthwash if you wish. Do not swallow any toothpaste or mouthwash.  Use CHG Soap or wipes as directed on instruction sheet.   Do not wear jewelry, make-up, hairpins, clips or nail polish.  For welded (permanent) jewelry: bracelets, anklets, waist bands, etc.  Please have this removed prior to surgery.  If it is not removed, there is a chance that hospital personnel will need to cut it off on the day of surgery.  Do not wear lotions, powders, or perfumes.   Do not shave body hair from the neck down 48 hours before surgery.  Contact lenses, hearing aids and dentures may not be worn into surgery.  Do not bring valuables to the hospital. [facility] is not responsible for any missing/lost belongings or valuables.   Notify your doctor if there is any change in your medical condition (cold, fever, infection).   Wear comfortable clothing (specific to your surgery type) to the hospital.  After surgery, you can help prevent lung complications by doing breathing exercises.  Take deep breaths and cough every 1-2 hours. Your doctor may order a device called an Incentive Spirometer to help you take deep breaths. When coughing or sneezing, hold a pillow firmly against your incision with both hands. This is called splinting. Doing this helps protect your incision. It also decreases belly discomfort.  If you are being discharged the day of surgery, you will not be allowed to drive home. You will  need a responsible individual to drive you home and stay with you for 24 hours after surgery.   If you are taking public transportation, you will need to have a responsible individual with you.  Please call the Pre-admissions Testing Dept. at [phone] if you have any questions about these instructions.  Surgery Visitation Policy:  Patients having surgery or a procedure may have two visitors.  Children under the age of  85 must have an adult with them who is not the patient.  Merchandiser, Retail to address health-related social needs:  [email or web address].no                                                                                                             Preparing for Surgery with CHLORHEXIDINE  GLUCONATE (CHG) Soap  Chlorhexidine  Gluconate (CHG) Soap  o An antiseptic cleaner that kills germs and bonds with the skin to continue killing germs even after washing  o Used for showering the night before surgery and morning of surgery  Before surgery, you can play an important role by reducing the number of germs on your skin.  CHG (Chlorhexidine  gluconate) soap is an antiseptic cleanser which kills germs and bonds with the skin to continue killing germs even after washing.  Please do not use if you have an allergy to CHG or antibacterial soaps. If your skin becomes reddened/irritated stop using the CHG.  1. Shower the NIGHT BEFORE SURGERY with CHG soap.  2. If you choose to wash your hair, wash your hair first as usual with your normal shampoo.  3. After shampooing, rinse your hair and body thoroughly to remove the shampoo.  4. Use CHG as you would any other liquid soap. You can apply CHG directly to the skin and wash gently with a clean washcloth.  5. Apply the CHG soap to your body only from the neck down. Do not use on open wounds or open sores. Avoid contact with your eyes, ears, mouth, and genitals (private parts). Wash face and genitals (private parts) with your normal soap.  6. Wash thoroughly, paying special attention to the area where your surgery will be performed.  7. Thoroughly rinse your body with warm water .  8. Do not shower/wash with your normal soap after using and rinsing off the CHG soap.  9. Do not use lotions, oils, etc., after showering with CHG.  10. Pat yourself dry with a clean towel.  11. Wear clean pajamas to bed the night before  surgery.  12. Place clean sheets on your bed the night of your shower and do not sleep with pets.  13. Do not apply any deodorants/lotions/powders.  14. Please wear clean clothes to the hospital.  15. Remember to brush your teeth with your regular toothpaste.

## 2024-08-18 ENCOUNTER — Telehealth: Payer: Self-pay

## 2024-08-18 NOTE — Telephone Encounter (Signed)
 Patient called and needs to reschedule her surgery for January 7th to February. The patient is rescheduled for surgery on February 18th 2026. She will come in for a short pre op visit on February 10th.

## 2024-08-19 ENCOUNTER — Telehealth: Payer: Self-pay | Admitting: General Surgery

## 2024-08-19 NOTE — Telephone Encounter (Signed)
 Updated information regarding rescheduled surgery.   Patient has been advised of Pre-Admission date/time, and Surgery date at [facility].  Surgery Date: 10/01/24 Preadmission Testing Date: 09/29/24 (phone 8a-1p)  Patient informed of the scheduling process and surgery information given at time of office visit.  Patient has been made aware to call [phone], between 1-3:00pm the day before surgery, to find out what time to arrive for surgery.

## 2024-09-01 NOTE — Progress Notes (Unsigned)
" ° °  ANNUAL EXAM Patient name: Jennesis Ramaswamy MRN [id number]  Date of birth: 07-20-1996 Chief Complaint:   No chief complaint on file.  History of Present Illness:   Chelsy Parrales is a 29 y.o. G0P0000 {race:25618} female being seen today for a routine annual exam.  Current complaints: ***  No LMP recorded.       The pregnancy intention screening data noted above was reviewed. Potential methods of contraception were discussed. The patient elected to proceed with No data recorded.      Component Value Date/Time   DIAGPAP  07/11/2023 1023    - Negative for intraepithelial lesion or malignancy (NILM)   ADEQPAP  07/11/2023 1023    Satisfactory for evaluation; transformation zone component PRESENT.      Last pap 07/11/23. Results were: negative. H/O abnormal pap: no Last mammogram: N/A. Results were: N/A. Family h/o breast cancer: {yes***/no:23838} Last colonoscopy: N/A. Results were: N/A. Family h/o colorectal cancer: {yes***/no:23838}     03/15/2023    2:15 PM  Depression screen PHQ 2/9  Decreased Interest 0  Down, Depressed, Hopeless 0  PHQ - 2 Score 0         No data to display            Past Medical History:  Diagnosis Date   Biliary colic    Carpal tunnel syndrome    PCOS (polycystic ovarian syndrome)     No family history on file. Review of Systems:   Pertinent items are noted in HPI Denies any headaches, blurred vision, fatigue, shortness of breath, chest pain, abdominal pain, abnormal vaginal discharge/itching/odor/irritation, problems with periods, bowel movements, urination, or intercourse unless otherwise stated above. Pertinent History Reviewed:  Reviewed past medical,surgical, social and family history.  Reviewed problem list, medications and allergies. Physical Assessment:  There were no vitals filed for this visit.There is no height or weight on file to calculate BMI.       Physical Exam   No results found for this or any previous  visit (from the past 24 hours).  Assessment & Plan:  There are no diagnoses linked to this encounter.  Mammogram: {Mammo f/u:25212::@ 29yo}, or sooner if problems Colonoscopy: {TCS f/u:25213::@ 29yo}, or sooner if problems  No orders of the defined types were placed in this encounter.   Meds: No orders of the defined types were placed in this encounter.   Follow-up: No follow-ups on file.  Rollo JINNY Maxin, CMA 09/01/2024 2:22 PM  "

## 2024-09-01 NOTE — Patient Instructions (Incomplete)
 Preventive Care 27-27 Years Old, Female  Preventive care refers to lifestyle choices and visits with your health care provider that can promote health and wellness. Preventive care visits are also called wellness exams. What can I expect for my preventive care visit? Counseling During your preventive care visit, your health care provider may ask about your: Medical history, including: Past medical problems. Family medical history. Pregnancy history. Current health, including: Menstrual cycle. Method of birth control. Emotional well-being. Home life and relationship well-being. Sexual activity and sexual health. Lifestyle, including: Alcohol, nicotine or tobacco, and drug use. Access to firearms. Diet, exercise, and sleep habits. Work and work Astronomer. Sunscreen use. Safety issues such as seatbelt and bike helmet use. Physical exam Your health care provider may check your: Height and weight. These may be used to calculate your BMI (body mass index). BMI is a measurement that tells if you are at a healthy weight. Waist circumference. This measures the distance around your waistline. This measurement also tells if you are at a healthy weight and may help predict your risk of certain diseases, such as type 2 diabetes and high blood pressure. Heart rate and blood pressure. Body temperature. Skin for abnormal spots. What immunizations do I need?  Vaccines are usually given at various ages, according to a schedule. Your health care provider will recommend vaccines for you based on your age, medical history, and lifestyle or other factors, such as travel or where you work. What tests do I need? Screening Your health care provider may recommend screening tests for certain conditions. This may include: Pelvic exam and Pap test. Lipid and cholesterol levels. Diabetes screening. This is done by checking your blood sugar (glucose) after you have not eaten for a while  (fasting). Hepatitis B test. Hepatitis C test. HIV (human immunodeficiency virus) test. STI (sexually transmitted infection) testing, if you are at risk. BRCA-related cancer screening. This may be done if you have a family history of breast, ovarian, tubal, or peritoneal cancers. Talk with your health care provider about your test results, treatment options, and if necessary, the need for more tests. Follow these instructions at home: Eating and drinking  Eat a healthy diet that includes fresh fruits and vegetables, whole grains, lean protein, and low-fat dairy products. Take vitamin and mineral supplements as recommended by your health care provider. Do not drink alcohol if: Your health care provider tells you not to drink. You are pregnant, may be pregnant, or are planning to become pregnant. If you drink alcohol: Limit how much you have to 0-1 drink a day. Know how much alcohol is in your drink. In the [place] one drink equals one 12 oz bottle of beer (355 mL), one 5 oz glass of wine (148 mL), or one 1 oz glass of hard liquor (44 mL). Lifestyle Brush your teeth every morning and night with fluoride toothpaste. Floss one time each day. Exercise for at least 30 minutes 5 or more days each week. Do not use any products that contain nicotine or tobacco. These products include cigarettes, chewing tobacco, and vaping devices, such as e-cigarettes. If you need help quitting, ask your health care provider. Do not use drugs. If you are sexually active, practice safe sex. Use a condom or other form of protection to prevent STIs. If you do not wish to become pregnant, use a form of birth control. If you plan to become pregnant, see your health care provider for a prepregnancy visit. Find healthy ways to manage stress, such as:  Meditation, yoga, or listening to music. Journaling. Talking to a trusted person. Spending time with friends and family. Minimize exposure to UV radiation to reduce your  risk of skin cancer. Safety Always wear your seat belt while driving or riding in a vehicle. Do not drive: If you have been drinking alcohol. Do not ride with someone who has been drinking. If you have been using any mind-altering substances or drugs. While texting. When you are tired or distracted. Wear a helmet and other protective equipment during sports activities. If you have firearms in your house, make sure you follow all gun safety procedures. Seek help if you have been physically or sexually abused. What's next? Go to your health care provider once a year for an annual wellness visit. Ask your health care provider how often you should have your eyes and teeth checked. Stay up to date on all vaccines. This information is not intended to replace advice given to you by your health care provider. Make sure you discuss any questions you have with your health care provider. Document Revised: 01/26/2021 Document Reviewed: 01/26/2021 Elsevier Patient Education  2024 [employer]     How to Do a Breast Self-Exam Doing breast self-exams can help you stay healthy. They're one way to know what's normal for your breasts. They can help you catch a problem while it's still small and can be treated. You need to: Check your breasts often. Tell your doctor about any changes. You should do breast self-exams even if you have breast implants. What you need: A mirror. A well-lit room. A pillow or other soft object. How to do a breast self-exam Look for changes  Take off all the clothes above your waist. Stand in front of a mirror in a room with good lighting. Put your hands down at your sides. Compare your breasts in the mirror. Look for difference between them, such as: Differences in shape. Differences in size. Wrinkles, dips, and bumps in one breast and not the other. Look at each breast for skin changes, such as: Redness. Scaly spots. Spots where your skin is  thicker. Dimpling. Open sores. Look for changes in your nipples, such as: Fluid coming out of a nipple. Fluid around a nipple. Bleeding. Dimpling. Redness. A nipple that looks pushed in or that has changed position. Feel for changes Lie on your back. Feel each breast. To do this: Pick a breast to feel. Place a pillow under the shoulder closest to that breast. Put the arm closest to that breast behind your head. Feel the breast using the hand of your other arm. Use the pads of your three middle fingers to make small circles starting near the nipple. Use light, medium, and firm pressure. Keep making circles, moving down over the breast. Stop when you feel your ribs. Start making circles with your fingers again, this time going up until you reach your collarbone. Then, make circles out across your breast and into your armpit area. Squeeze your nipple. Check for fluid and lumps. Do these steps again to check your other breast. Sit or stand in the tub or shower. With soapy water on your skin, feel each breast the same way you did when you were lying down. Write down what you find Writing down what you find can help you keep track of what you want to tell your doctor. Write down: What's normal for each breast. Any changes you find. Write down: The kind of change. If your breast feels tender or painful.  Any lump you find. Write down its size and where it is. When you last had your period. General tips If you're breastfeeding, the best time to check your breasts is after you feed your baby or after you use a breast pump. If you get a period, the best time to check your breasts is 5-7 days after your period ends. With time, you'll get more used to doing the self-exam. You'll also start to know if there are changes in your breasts. Contact a doctor if: You see a change in the shape or size of your breasts or nipples. You see a change in the skin of your breast or nipples. You have fluid  coming from your nipples that isn't normal. You find a new lump or thick area. You have breast pain. You have any concerns about your breast health. This information is not intended to replace advice given to you by your health care provider. Make sure you discuss any questions you have with your health care provider. Document Revised: 10/10/2023 Document Reviewed: 10/10/2023 Elsevier Patient Education  2025 [employer].

## 2024-09-02 ENCOUNTER — Ambulatory Visit: Admitting: Certified Nurse Midwife

## 2024-09-02 ENCOUNTER — Encounter: Payer: Self-pay | Admitting: Certified Nurse Midwife

## 2024-09-02 VITALS — BP 99/60 | HR 70 | Ht 60.0 in | Wt 134.2 lb

## 2024-09-02 DIAGNOSIS — N941 Unspecified dyspareunia: Secondary | ICD-10-CM

## 2024-09-02 DIAGNOSIS — Z01411 Encounter for gynecological examination (general) (routine) with abnormal findings: Secondary | ICD-10-CM | POA: Diagnosis not present

## 2024-09-02 DIAGNOSIS — Z01419 Encounter for gynecological examination (general) (routine) without abnormal findings: Secondary | ICD-10-CM

## 2024-09-02 MED ORDER — LIDOCAINE 5 % EX OINT: 1.0000 | TOPICAL_OINTMENT | CUTANEOUS | 1 refills | Status: AC | PRN

## 2024-09-02 MED ORDER — NORELGESTROMIN-ETH ESTRADIOL 150-35 MCG/24HR TD PTWK: 1.0000 | MEDICATED_PATCH | TRANSDERMAL | 4 refills | Status: AC

## 2024-09-03 ENCOUNTER — Encounter: Payer: Self-pay | Admitting: Certified Nurse Midwife

## 2024-09-10 ENCOUNTER — Encounter: Admitting: Registered"

## 2024-09-23 ENCOUNTER — Ambulatory Visit: Admitting: General Surgery

## 2024-09-29 ENCOUNTER — Other Ambulatory Visit

## 2024-10-01 ENCOUNTER — Encounter: Admission: RE | Payer: Self-pay | Source: Home / Self Care

## 2024-10-01 ENCOUNTER — Ambulatory Visit: Admission: RE | Admit: 2024-10-01 | Source: Home / Self Care | Admitting: General Surgery

## 2024-10-01 SURGERY — CHOLECYSTECTOMY, ROBOT-ASSISTED, LAPAROSCOPIC
Anesthesia: General

## 2024-10-08 ENCOUNTER — Encounter: Admitting: Registered"
